# Patient Record
Sex: Female | Born: 1970 | Race: White | Hispanic: No | Marital: Married | Smoking: Never smoker
Health system: Southern US, Community
[De-identification: ages and names within clinical notes are randomized; demographics above are authoritative.]

## PROBLEM LIST (undated history)

## (undated) DIAGNOSIS — T7840XA Allergy, unspecified, initial encounter: Secondary | ICD-10-CM

## (undated) DIAGNOSIS — D649 Anemia, unspecified: Secondary | ICD-10-CM

## (undated) DIAGNOSIS — C4490 Unspecified malignant neoplasm of skin, unspecified: Secondary | ICD-10-CM

## (undated) HISTORY — DX: Unspecified malignant neoplasm of skin, unspecified: C44.90

## (undated) HISTORY — DX: Anemia, unspecified: D64.9

## (undated) HISTORY — DX: Allergy, unspecified, initial encounter: T78.40XA

---

## 2019-09-15 ENCOUNTER — Ambulatory Visit
Admission: EM | Admit: 2019-09-15 | Discharge: 2019-09-15 | Disposition: A | Discharge status: Home / Self Care | Payer: 59 | Attending: Emergency Medicine | Admitting: Emergency Medicine

## 2019-09-15 ENCOUNTER — Other Ambulatory Visit: Payer: Self-pay

## 2019-09-15 ENCOUNTER — Encounter: Payer: Self-pay | Admitting: Emergency Medicine

## 2019-09-15 DIAGNOSIS — J069 Acute upper respiratory infection, unspecified: Secondary | ICD-10-CM

## 2019-09-15 DIAGNOSIS — Z1152 Encounter for screening for COVID-19: Secondary | ICD-10-CM

## 2019-09-15 MED ORDER — CETIRIZINE HCL 10 MG PO TABS: 10.0000 mg | ORAL_TABLET | Freq: Every day | ORAL | 0 refills | Status: AC

## 2019-09-15 MED ORDER — FLUTICASONE PROPIONATE 50 MCG/ACT NA SUSP: 1.0000 | Freq: Every day | NASAL | 0 refills | Status: DC

## 2019-09-15 MED ORDER — PREDNISONE 10 MG PO TABS: 20.0000 mg | ORAL_TABLET | Freq: Every day | ORAL | 0 refills | Status: DC

## 2019-09-15 MED ORDER — BENZONATATE 100 MG PO CAPS: 100.0000 mg | ORAL_CAPSULE | Freq: Three times a day (TID) | ORAL | 0 refills | Status: DC

## 2019-09-15 NOTE — Discharge Instructions (Signed)
COVID testing ordered.  It will take between 2-7 days for test results.  Someone will contact you regarding abnormal results.    In the meantime: You should remain isolated in your home for 10 days from symptom onset AND greater than 24 hours after symptoms resolution (absence of fever without the use of fever-reducing medication and improvement in respiratory symptoms), whichever is longer Get plenty of rest and push fluids Tessalon Perles prescribed for cough Zyrtec for nasal congestion, runny nose, and/or sore throat Flonase for nasal congestion and runny nose Low-dose prednisone was prescribed Use medications daily for symptom relief Use OTC medications like ibuprofen or tylenol as needed fever or pain Call or go to the ED if you have any new or worsening symptoms such as fever, worsening cough, shortness of breath, chest tightness, chest pain, turning blue, changes in mental status, etc..Marland Kitchen

## 2019-09-15 NOTE — ED Provider Notes (Signed)
[place]   [id number] 09/15/19 Arrival Time: 7341   CC: COVID symptoms  SUBJECTIVE: History from: patient.  Marilyn Smith is a 49 y.o. female who presents to the urgent care for complaint of nasal congestion, cough for the past few days.  Denies recent travel.  Has tried OTC medication without relief.  Denies alleviating or aggravating factors.  Denies previous symptoms in the past.   Denies fever, chills, fatigue, sinus pain, rhinorrhea, sore throat, SOB, wheezing, chest pain, nausea, changes in bowel or bladder habits.      ROS: As per HPI.  All other pertinent ROS negative.     History reviewed. No pertinent past medical history. History reviewed. No pertinent surgical history. Allergies  Allergen Reactions  . Penicillins Rash   No current facility-administered medications on file prior to encounter.   No current outpatient medications on file prior to encounter.   Social History   Socioeconomic History  . Marital status: Married    Spouse name: Not on file  . Number of children: Not on file  . Years of education: Not on file  . Highest education level: Not on file  Occupational History  . Not on file  Tobacco Use  . Smoking status: Never Smoker  . Smokeless tobacco: Never Used  Substance and Sexual Activity  . Alcohol use: Never  . Drug use: Never  . Sexual activity: Not on file  Other Topics Concern  . Not on file  Social History Narrative  . Not on file   Social Determinants of Health   Financial Resource Strain:   . Difficulty of Paying Living Expenses: Not on file  Food Insecurity:   . Worried About Charity fundraiser in the Last Year: Not on file  . Ran Out of Food in the Last Year: Not on file  Transportation Needs:   . Lack of Transportation (Medical): Not on file  . Lack of Transportation (Non-Medical): Not on file  Physical Activity:   . Days of Exercise per Week: Not on file  . Minutes of Exercise per Session: Not on file    Stress:   . Feeling of Stress : Not on file  Social Connections:   . Frequency of Communication with Friends and Family: Not on file  . Frequency of Social Gatherings with Friends and Family: Not on file  . Attends Religious Services: Not on file  . Active Member of Clubs or Organizations: Not on file  . Attends Archivist Meetings: Not on file  . Marital Status: Not on file  Intimate Partner Violence:   . Fear of Current or Ex-Partner: Not on file  . Emotionally Abused: Not on file  . Physically Abused: Not on file  . Sexually Abused: Not on file   No family history on file.  OBJECTIVE:  Vitals:   09/15/19 1454 09/15/19 1529  BP: 124/80   Pulse: 92   Resp: 16   Temp: 98.2 F (36.8 C)   TempSrc: Oral   SpO2: 98%   Weight:  140 lb (63.5 kg)  Height:  5' 8"  (1.727 m)     General appearance: alert; appears fatigued, but nontoxic; speaking in full sentences and tolerating own secretions HEENT: NCAT; Ears: EACs clear, TMs pearly gray; Eyes: PERRL.  EOM grossly intact. Sinuses: nontender; Nose: nares patent without rhinorrhea, Throat: oropharynx clear, tonsils non erythematous or enlarged, uvula midline  Neck: supple without LAD Lungs: unlabored respirations, symmetrical air entry; cough: moderate; no respiratory distress;  CTAB Heart: regular rate and rhythm.  Radial pulses 2+ symmetrical bilaterally Skin: warm and dry Psychological: alert and cooperative; normal mood and affect  LABS:  No results found for this or any previous visit (from the past 24 hour(s)).   ASSESSMENT & PLAN:  1. URI with cough and congestion   2. Encounter for screening for COVID-19     Meds ordered this encounter  Medications  . cetirizine (ZYRTEC ALLERGY) 10 MG tablet    Sig: Take 1 tablet (10 mg total) by mouth daily.    Dispense:  30 tablet    Refill:  0  . fluticasone (FLONASE) 50 MCG/ACT nasal spray    Sig: Place 1 spray into both nostrils daily for 14 days.    Dispense:   16 g    Refill:  0  . benzonatate (TESSALON) 100 MG capsule    Sig: Take 1 capsule (100 mg total) by mouth every 8 (eight) hours.    Dispense:  30 capsule    Refill:  0  . predniSONE (DELTASONE) 10 MG tablet    Sig: Take 2 tablets (20 mg total) by mouth daily.    Dispense:  15 tablet    Refill:  0    Discharge Instructions.Marland Kitchen    COVID testing ordered.  It will take between 2-7 days for test results.  Someone will contact you regarding abnormal results.    In the meantime: You should remain isolated in your home for 10 days from symptom onset AND greater than 24 hours after symptoms resolution (absence of fever without the use of fever-reducing medication and improvement in respiratory symptoms), whichever is longer Get plenty of rest and push fluids Tessalon Perles prescribed for cough Zyrtec for nasal congestion, runny nose, and/or sore throat Flonase for nasal congestion and runny nose Low-dose prednisone was prescribed Use medications daily for symptom relief Use OTC medications like ibuprofen or tylenol as needed fever or pain Call or go to the ED if you have any new or worsening symptoms such as fever, worsening cough, shortness of breath, chest tightness, chest pain, turning blue, changes in mental status, etc...   Reviewed expectations re: course of current medical issues. Questions answered. Outlined signs and symptoms indicating need for more acute intervention. Patient verbalized understanding. After Visit Summary given.      Note: This document was prepared using Dragon voice recognition software and may include unintentional dictation errors.    Emerson Monte, [place] 09/15/19 1539

## 2019-09-15 NOTE — ED Triage Notes (Signed)
Coughing up phlegm and head congestion, started last Sunday

## 2019-09-16 LAB — NOVEL CORONAVIRUS, NAA: SARS-CoV-2, NAA: DETECTED — AB

## 2019-09-17 ENCOUNTER — Telehealth: Payer: Self-pay | Admitting: Oncology

## 2019-09-17 NOTE — Telephone Encounter (Signed)
Patient does not meet qualifications for Mab infusion.   Onset of symptoms is greater than 10 days.   Faythe Casa, NP 09/17/2019 1:50 PM

## 2020-04-30 ENCOUNTER — Telehealth (INDEPENDENT_AMBULATORY_CARE_PROVIDER_SITE_OTHER): Payer: Self-pay

## 2020-04-30 NOTE — Telephone Encounter (Signed)
Is this patient OK to set up as a new patient also? I know she is the wife and mother of the other requests.

## 2020-04-30 NOTE — Telephone Encounter (Signed)
Yes, sorry I got interrupted before I could finish chart review. But yes she should be acceptable as well.

## 2020-04-30 NOTE — Telephone Encounter (Signed)
Patient called and stated that she would like to be established as a new patient with [place]. Her husband did some painting for Dr. Anastasio Champion and stated that they would like to come to our practice as they want a PCP and have heard wonderful things about our practice.  No opioids/controlled substances OK to set up as new patient?

## 2020-04-30 NOTE — Telephone Encounter (Signed)
Scheduled patient for new patient appointment on 05/27/2020 with Jeralyn Ruths.

## 2020-05-27 ENCOUNTER — Ambulatory Visit (INDEPENDENT_AMBULATORY_CARE_PROVIDER_SITE_OTHER): Payer: 59 | Admitting: Nurse Practitioner

## 2020-05-27 ENCOUNTER — Encounter (INDEPENDENT_AMBULATORY_CARE_PROVIDER_SITE_OTHER): Payer: Self-pay | Admitting: Nurse Practitioner

## 2020-05-27 ENCOUNTER — Other Ambulatory Visit: Payer: Self-pay

## 2020-05-27 VITALS — BP 102/70 | HR 82 | Temp 96.3°F | Ht 66.5 in | Wt 143.6 lb

## 2020-05-27 DIAGNOSIS — Z13220 Encounter for screening for lipoid disorders: Secondary | ICD-10-CM

## 2020-05-27 DIAGNOSIS — Z131 Encounter for screening for diabetes mellitus: Secondary | ICD-10-CM | POA: Diagnosis not present

## 2020-05-27 DIAGNOSIS — Z1329 Encounter for screening for other suspected endocrine disorder: Secondary | ICD-10-CM | POA: Diagnosis not present

## 2020-05-27 DIAGNOSIS — Z1389 Encounter for screening for other disorder: Secondary | ICD-10-CM

## 2020-05-27 NOTE — Progress Notes (Signed)
Subjective:  Patient ID: Marilyn Smith, female    DOB: [birth year]  Age: 50 y.o. MRN: [id number]  CC:  Chief Complaint  Patient presents with  . Establish Care    Doing well, no concerns      HPI  This patient arrives today for the above.  She is here to establish care because she has not been receiving routine primary care from a doctor in recent history.  She does not have any concerns, but feels that she is time for her to get established with a primary care provider.  Past Medical History:  Diagnosis Date  . Allergy       History reviewed. No pertinent family history.  Social History   Social History Narrative  . Not on file   Social History   Tobacco Use  . Smoking status: Never Smoker  . Smokeless tobacco: Never Used  Substance Use Topics  . Alcohol use: Never     Current Meds  Medication Sig  . cetirizine (ZYRTEC ALLERGY) 10 MG tablet Take 1 tablet (10 mg total) by mouth daily.  . MULTIPLE VITAMIN PO Take 1 capsule by mouth daily.    ROS:  Review of Systems  Constitutional: Negative for fever, malaise/fatigue and weight loss.  Eyes: Negative for blurred vision.  Respiratory: Negative for shortness of breath.   Cardiovascular: Negative for chest pain.  Gastrointestinal: Negative for abdominal pain and blood in stool.  Neurological: Negative for dizziness and headaches.  Psychiatric/Behavioral: Negative for depression and suicidal ideas.     Objective:   Today's Vitals: BP 102/70   Pulse 82   Temp (!) 96.3 F (35.7 C) (Temporal)   Ht 5' 6.5" (1.689 m)   Wt 143 lb 9.6 oz (65.1 kg)   SpO2 99%   BMI 22.83 kg/m  Vitals with BMI 05/27/2020 09/15/2019  Height 5' 6.5" 5' 8"  Weight 143 lbs 10 oz 140 lbs  BMI 40.98 11.91  Systolic 478 295  Diastolic 70 80  Pulse 82 92     Physical Exam Vitals reviewed.  Constitutional:      General: She is not in acute distress.    Appearance: Normal appearance.  HENT:     Head: Normocephalic and  atraumatic.  Neck:     Vascular: No carotid bruit.  Cardiovascular:     Rate and Rhythm: Normal rate and regular rhythm.     Pulses: Normal pulses.     Heart sounds: Normal heart sounds.  Pulmonary:     Effort: Pulmonary effort is normal.     Breath sounds: Normal breath sounds.  Skin:    General: Skin is warm and dry.  Neurological:     General: No focal deficit present.     Mental Status: She is alert and oriented to person, place, and time.  Psychiatric:        Mood and Affect: Mood normal.        Behavior: Behavior normal.        Judgment: Judgment normal.          Assessment and Plan   1. Screening for multiple conditions   2. Screening, lipid   3. Screening for diabetes mellitus   4. Screening for thyroid disorder      Plan: 1.-4.  We will check routine blood work today for further evaluation.  She will follow-up in 1 month for annual physical exam, she was encouraged to call the office if she needs anything before then.  Tests ordered Orders Placed This Encounter  Procedures  . CMP with eGFR([facility])  . CBC with Differential/Platelets  . Lipid Panel  . Hemoglobin A1c  . TSH      No orders of the defined types were placed in this encounter.   Patient to follow-up in 1 month for annual physical exam or sooner as needed.  Ailene Ards, NP

## 2020-05-28 LAB — CBC WITH DIFFERENTIAL/PLATELET
Absolute Monocytes: 272 cells/uL (ref 200–950)
Basophils Absolute: 41 cells/uL (ref 0–200)
Basophils Relative: 0.6 %
Eosinophils Absolute: 68 cells/uL (ref 15–500)
Eosinophils Relative: 1 %
HCT: 33.8 % — ABNORMAL LOW (ref 35.0–45.0)
Hemoglobin: 9.9 g/dL — ABNORMAL LOW (ref 11.7–15.5)
Lymphs Abs: 1550 cells/uL (ref 850–3900)
MCH: 21.5 pg — ABNORMAL LOW (ref 27.0–33.0)
MCHC: 29.3 g/dL — ABNORMAL LOW (ref 32.0–36.0)
MCV: 73.3 fL — ABNORMAL LOW (ref 80.0–100.0)
MPV: 11.4 fL (ref 7.5–12.5)
Monocytes Relative: 4 %
Neutro Abs: 4869 cells/uL (ref 1500–7800)
Neutrophils Relative %: 71.6 %
Platelets: 222 10*3/uL (ref 140–400)
RBC: 4.61 10*6/uL (ref 3.80–5.10)
RDW: 14.5 % (ref 11.0–15.0)
Total Lymphocyte: 22.8 %
WBC: 6.8 10*3/uL (ref 3.8–10.8)

## 2020-05-28 LAB — COMPLETE METABOLIC PANEL WITH GFR
AG Ratio: 2.1 (calc) (ref 1.0–2.5)
ALT: 14 U/L (ref 6–29)
AST: 15 U/L (ref 10–35)
Albumin: 4.7 g/dL (ref 3.6–5.1)
Alkaline phosphatase (APISO): 68 U/L (ref 31–125)
BUN: 22 mg/dL (ref 7–25)
CO2: 26 mmol/L (ref 20–32)
Calcium: 10 mg/dL (ref 8.6–10.2)
Chloride: 105 mmol/L (ref 98–110)
Creat: 0.73 mg/dL (ref 0.50–1.10)
GFR, Est African American: 113 mL/min/{1.73_m2} (ref >=60)
GFR, Est Non African American: 97 mL/min/{1.73_m2} (ref >=60)
Globulin: 2.2 g/dL (calc) (ref 1.9–3.7)
Glucose, Bld: 119 mg/dL (ref 65–139)
Potassium: 4.3 mmol/L (ref 3.5–5.3)
Sodium: 140 mmol/L (ref 135–146)
Total Bilirubin: 0.4 mg/dL (ref 0.2–1.2)
Total Protein: 6.9 g/dL (ref 6.1–8.1)

## 2020-05-28 LAB — HEMOGLOBIN A1C
Hgb A1c MFr Bld: 5 % of total Hgb (ref <5.7)
Mean Plasma Glucose: 97 mg/dL
eAG (mmol/L): 5.4 mmol/L

## 2020-05-28 LAB — LIPID PANEL
Cholesterol: 155 mg/dL (ref <200)
HDL: 58 mg/dL (ref >=50)
LDL Cholesterol (Calc): 78 mg/dL (calc)
Non-HDL Cholesterol (Calc): 97 mg/dL (calc) (ref <130)
Total CHOL/HDL Ratio: 2.7 (calc) (ref <5.0)
Triglycerides: 107 mg/dL (ref <150)

## 2020-05-28 LAB — TSH: TSH: 4.19 mIU/L

## 2020-07-15 ENCOUNTER — Telehealth (INDEPENDENT_AMBULATORY_CARE_PROVIDER_SITE_OTHER): Payer: 59 | Admitting: Nurse Practitioner

## 2020-07-15 ENCOUNTER — Other Ambulatory Visit: Payer: Self-pay

## 2020-07-15 ENCOUNTER — Ambulatory Visit (INDEPENDENT_AMBULATORY_CARE_PROVIDER_SITE_OTHER): Payer: 59 | Admitting: Nurse Practitioner

## 2020-07-15 ENCOUNTER — Encounter (INDEPENDENT_AMBULATORY_CARE_PROVIDER_SITE_OTHER): Payer: Self-pay | Admitting: Nurse Practitioner

## 2020-07-15 VITALS — BP 118/70 | Temp 97.8°F | Resp 18 | Ht 68.0 in | Wt 144.0 lb

## 2020-07-15 DIAGNOSIS — D509 Iron deficiency anemia, unspecified: Secondary | ICD-10-CM | POA: Diagnosis not present

## 2020-07-15 DIAGNOSIS — Z1211 Encounter for screening for malignant neoplasm of colon: Secondary | ICD-10-CM

## 2020-07-15 DIAGNOSIS — M25561 Pain in right knee: Secondary | ICD-10-CM | POA: Diagnosis not present

## 2020-07-15 DIAGNOSIS — Z0001 Encounter for general adult medical examination with abnormal findings: Secondary | ICD-10-CM | POA: Diagnosis not present

## 2020-07-15 NOTE — Progress Notes (Signed)
Subjective:  Patient ID: Marilyn Smith, female    DOB: [birth year]  Age: 50 y.o. MRN: [id number]  CC:  Chief Complaint  Patient presents with   Annual Exam      HPI  This patient arrives today for the above.  As far as health maintenance is concerned she has not had the COVID-19 vaccine is not interested in having this administered right now.  She is probably overdue for tetanus shot as well.  She has not yet had colon cancer screening but is agreeable to be referred to gastroenterology for colonoscopy.  She tells me she is scheduled to have mammogram completed in August of this year.  She does report that she has been having some right knee pain for the last few years.  It is intermittent and seems to get worse sometimes at night, right before her menstrual cycle, and if she bends or squats down.  She has not had it evaluated before now but brings it up today only because it is a bit achy today.  Blood work was collected at last office visit so she is due to discuss this today.  Overall blood work looked great, except she did have some microcytic anemia.  She is still having menstrual cycles.  Past Medical History:  Diagnosis Date   Allergy       History reviewed. No pertinent family history.  Social History   Social History Narrative   Not on file   Social History   Tobacco Use   Smoking status: Never   Smokeless tobacco: Never  Substance Use Topics   Alcohol use: Never     Current Meds  Medication Sig   cetirizine (ZYRTEC ALLERGY) 10 MG tablet Take 1 tablet (10 mg total) by mouth daily.   MULTIPLE VITAMIN PO Take 1 capsule by mouth daily.    ROS:  Review of Systems  Respiratory:  Negative for shortness of breath.   Cardiovascular:  Negative for chest pain.  Neurological:  Negative for dizziness and headaches.    Objective:   Today's Vitals: BP 118/70 (BP Location: Left Arm, Patient Position: Sitting, Cuff Size: Normal)   Temp 97.8 F (36.6 C)  (Temporal)   Resp 18   Ht 5' 8"  (1.727 m)   Wt 144 lb (65.3 kg)   LMP 07/14/2020   SpO2 99%   BMI 21.90 kg/m  Vitals with BMI 07/15/2020 05/27/2020 09/15/2019  Height 5' 8"  5' 6.5" 5' 8"   Weight 144 lbs 143 lbs 10 oz 140 lbs  BMI 21.9 82.80 03.49  Systolic 179 150 569  Diastolic 70 70 80  Pulse - 82 92     Physical Exam Vitals reviewed. Exam conducted with a chaperone present.  Constitutional:      Appearance: Normal appearance.  HENT:     Head: Normocephalic and atraumatic.     Right Ear: Tympanic membrane, ear canal and external ear normal.     Left Ear: Tympanic membrane, ear canal and external ear normal.  Eyes:     General:        Right eye: No discharge.        Left eye: No discharge.     Extraocular Movements: Extraocular movements intact.     Conjunctiva/sclera: Conjunctivae normal.     Pupils: Pupils are equal, round, and reactive to light.  Neck:     Vascular: No carotid bruit.  Cardiovascular:     Rate and Rhythm: Normal rate and regular rhythm.  Pulses: Normal pulses.     Heart sounds: Normal heart sounds. No murmur heard. Pulmonary:     Effort: Pulmonary effort is normal.     Breath sounds: Normal breath sounds.  Chest:  Breasts:    Breasts are symmetrical.     Right: Normal. No supraclavicular adenopathy.     Left: Normal. No supraclavicular adenopathy.     Comments: Nellie Hairston as chaperone Abdominal:     General: Abdomen is flat. Bowel sounds are normal. There is no distension.     Palpations: Abdomen is soft. There is no mass.     Tenderness: There is no abdominal tenderness.  Musculoskeletal:        General: No tenderness.     Cervical back: Neck supple. No muscular tenderness.     Right knee: No swelling or effusion. No tenderness.     Left knee: No swelling or effusion. No tenderness.     Right lower leg: No edema.     Left lower leg: No edema.  Lymphadenopathy:     Cervical: No cervical adenopathy.     Upper Body:     Right upper  body: No supraclavicular adenopathy.     Left upper body: No supraclavicular adenopathy.  Skin:    General: Skin is warm and dry.  Neurological:     General: No focal deficit present.     Mental Status: She is alert and oriented to person, place, and time.     Motor: No weakness.     Gait: Gait normal.  Psychiatric:        Mood and Affect: Mood normal.        Behavior: Behavior normal.        Judgment: Judgment normal.         Assessment and Plan   1. Microcytic anemia   2. Colon cancer screening   3. Acute pain of right knee      Plan: Will evaluate her anemia further by collecting further blood work today.  We will also refer to gastroenterology for further evaluation. 2.  We will refer to gastroenterology for colon cancer screening as well. 3.  We will order ultrasound of the right knee to rule out Baker's cyst.  No significant abnormalities noted on exam today very low suspicion for DVT or infection currently. 4.  She will follow-up with OB/GYN for her mammogram and will go to gastroenterology for colon cancer screening.  We will hold off on tetanus shot today but she was told if she has a cut or any kind of burn to call us at which point we can give her tetanus shot for treatment of this.   Tests ordered Orders Placed This Encounter  Procedures   [place] RT LOWER EXTREM LTD SOFT TISSUE NON VASCULAR   CBC   Iron   Ferritin   Ambulatory referral to Gastroenterology      No orders of the defined types were placed in this encounter.   Patient to follow-up in 3 months or sooner as needed.  In addition to performing an annual physical exam also performed an office visit to address her concerns and abnormalities found on her lab work.  Ailene Ards, NP

## 2020-07-15 NOTE — Telephone Encounter (Signed)
Ordered ultrasound of the right knee.

## 2020-07-16 ENCOUNTER — Encounter (INDEPENDENT_AMBULATORY_CARE_PROVIDER_SITE_OTHER): Payer: Self-pay | Admitting: *Deleted

## 2020-07-17 ENCOUNTER — Other Ambulatory Visit (INDEPENDENT_AMBULATORY_CARE_PROVIDER_SITE_OTHER): Payer: Self-pay | Admitting: Nurse Practitioner

## 2020-07-17 LAB — CBC
HCT: 33.3 % — ABNORMAL LOW (ref 35.0–45.0)
Hemoglobin: 9.7 g/dL — ABNORMAL LOW (ref 11.7–15.5)
MCH: 21.7 pg — ABNORMAL LOW (ref 27.0–33.0)
MCHC: 29.1 g/dL — ABNORMAL LOW (ref 32.0–36.0)
MCV: 74.3 fL — ABNORMAL LOW (ref 80.0–100.0)
MPV: 11.4 fL (ref 7.5–12.5)
Platelets: 291 10*3/uL (ref 140–400)
RBC: 4.48 10*6/uL (ref 3.80–5.10)
RDW: 16.8 % — ABNORMAL HIGH (ref 11.0–15.0)
WBC: 6.2 10*3/uL (ref 3.8–10.8)

## 2020-07-17 LAB — IRON: Iron: 17 ug/dL — ABNORMAL LOW (ref 40–190)

## 2020-07-17 LAB — FERRITIN: Ferritin: 2 ng/mL — ABNORMAL LOW (ref 16–232)

## 2020-07-17 MED ORDER — IRON (FERROUS SULFATE) 325 (65 FE) MG PO TABS: 325.0000 mg | ORAL_TABLET | Freq: Every day | ORAL | 0 refills | Status: DC

## 2020-07-17 NOTE — Progress Notes (Signed)
Adding iron to patient medication list

## 2020-07-27 NOTE — Telephone Encounter (Signed)
Called her we need a copy of insurance card to be sent ASAP.  So I can  get this approved.

## 2020-08-04 ENCOUNTER — Encounter (INDEPENDENT_AMBULATORY_CARE_PROVIDER_SITE_OTHER): Payer: 59 | Admitting: Nurse Practitioner

## 2020-08-07 ENCOUNTER — Ambulatory Visit (HOSPITAL_COMMUNITY)
Admission: RE | Admit: 2020-08-07 | Discharge: 2020-08-07 | Disposition: A | Discharge status: Home / Self Care | Payer: 59 | Source: Ambulatory Visit | Attending: Nurse Practitioner | Admitting: Nurse Practitioner

## 2020-08-07 ENCOUNTER — Other Ambulatory Visit: Payer: Self-pay

## 2020-08-07 DIAGNOSIS — M25561 Pain in right knee: Secondary | ICD-10-CM | POA: Diagnosis present

## 2020-10-15 ENCOUNTER — Ambulatory Visit (INDEPENDENT_AMBULATORY_CARE_PROVIDER_SITE_OTHER): Payer: 59 | Admitting: Internal Medicine

## 2021-01-17 HISTORY — PX: SKIN CANCER EXCISION: SHX779

## 2021-04-22 ENCOUNTER — Ambulatory Visit (INDEPENDENT_AMBULATORY_CARE_PROVIDER_SITE_OTHER): Payer: 59 | Admitting: Nurse Practitioner

## 2021-04-22 ENCOUNTER — Encounter: Payer: Self-pay | Admitting: Nurse Practitioner

## 2021-04-22 VITALS — BP 123/82 | HR 86 | Ht 68.0 in | Wt 141.0 lb

## 2021-04-22 DIAGNOSIS — Z1211 Encounter for screening for malignant neoplasm of colon: Secondary | ICD-10-CM

## 2021-04-22 DIAGNOSIS — Z139 Encounter for screening, unspecified: Secondary | ICD-10-CM | POA: Diagnosis not present

## 2021-04-22 DIAGNOSIS — Z23 Encounter for immunization: Secondary | ICD-10-CM | POA: Diagnosis not present

## 2021-04-22 DIAGNOSIS — D509 Iron deficiency anemia, unspecified: Secondary | ICD-10-CM | POA: Diagnosis not present

## 2021-04-22 DIAGNOSIS — T7840XA Allergy, unspecified, initial encounter: Secondary | ICD-10-CM

## 2021-04-22 MED ORDER — IRON (FERROUS SULFATE) 325 (65 FE) MG PO TABS: 325.0000 mg | ORAL_TABLET | Freq: Every day | ORAL | 3 refills | Status: AC

## 2021-04-22 NOTE — Assessment & Plan Note (Signed)
Patient educated on CDC recommendation for the vaccine. Verbal consent was obtained from the patient, vaccine administered by nurse, no sign of adverse reactions noted at this time. Patient education on arm soreness and use of tylenol  for this patient  was discussed. Patient educated on the signs and symptoms of adverse effect and advise to contact the office if they occur.  ?

## 2021-04-22 NOTE — Progress Notes (Signed)
? ?New Patient Office Visit ? ?Subjective:  ?Patient ID: Marilyn Smith, female    DOB: [birth year]  Age: 51 y.o. MRN: [id number] ? ?CC:  ?Chief Complaint  ?Patient presents with  ? New Patient (Initial Visit)  ?  NP  ? ? ?HPI ?Marilyn Smith with past medical history of anemia, allergies presents to establish care. Previous patient of Dr Conception Chancy.  ? ?She has not been taking iron pill, states that she was not aware that medication was prescribed, patient denies bloody stool, hematemesis, still having monthly periods but denies heavy bleeding. ? ?States that she had a normal mammogram last year, up-to-date with Pap smear, due for colonoscopy and Tdap vaccine.  ? ?Patient denies any concerns today ? ?Past Medical History:  ?Diagnosis Date  ? Allergy   ? Anemia   ? ? ?History reviewed. No pertinent surgical history. ? ?Family History  ?Problem Relation Age of Onset  ? Hypertension Mother   ? Diabetes Mother   ? Hypertension Father   ? Skin cancer Father   ? Breast cancer Neg Hx   ? Colon cancer Neg Hx   ? Lung cancer Neg Hx   ? ? ?Social History  ? ?Socioeconomic History  ? Marital status: Married  ?  Spouse name: Not on file  ? Number of children: 2  ? Years of education: Not on file  ? Highest education level: Not on file  ?Occupational History  ? Not on file  ?Tobacco Use  ? Smoking status: Never  ? Smokeless tobacco: Never  ?Vaping Use  ? Vaping Use: Never used  ?Substance and Sexual Activity  ? Alcohol use: Never  ? Drug use: Never  ? Sexual activity: Yes  ?Other Topics Concern  ? Not on file  ?Social History Narrative  ? Lives with her spouse.   ? ?Social Determinants of Health  ? ?Financial Resource Strain: Not on file  ?Food Insecurity: Not on file  ?Transportation Needs: Not on file  ?Physical Activity: Not on file  ?Stress: Not on file  ?Social Connections: Not on file  ?Intimate Partner Violence: Not on file  ? ? ?ROS ?Review of Systems  ?Constitutional: Negative.   ?Respiratory: Negative.    ?Cardiovascular:  Negative.   ?Gastrointestinal: Negative.   ?Neurological: Negative.   ?Hematological: Negative.   ?Psychiatric/Behavioral: Negative.    ? ?Objective:  ? ?Today's Vitals: BP 123/82 (BP Location: Left Arm, Patient Position: Sitting, Cuff Size: Normal)   Pulse 86   Ht _0  (1.727 m)   Wt 141 lb (64 kg)   LMP 04/03/2021 (Approximate)   SpO2 100%   BMI 21.44 kg/m?  ? ?Physical Exam ?Constitutional:   ?   General: She is not in acute distress. ?   Appearance: Normal appearance. She is not ill-appearing, toxic-appearing or diaphoretic.  ?Cardiovascular:  ?   Rate and Rhythm: Normal rate and regular rhythm.  ?   Pulses: Normal pulses.  ?   Heart sounds: Normal heart sounds. No murmur heard. ?  No friction rub. No gallop.  ?Pulmonary:  ?   Effort: Pulmonary effort is normal. No respiratory distress.  ?   Breath sounds: Normal breath sounds. No stridor. No wheezing, rhonchi or rales.  ?Chest:  ?   Chest wall: No tenderness.  ?Abdominal:  ?   Palpations: Abdomen is soft.  ?   Tenderness: There is no abdominal tenderness.  ?Skin: ?   Capillary Refill: Capillary refill takes less than 2 seconds.  ?Neurological:  ?  Mental Status: She is alert and oriented to person, place, and time.  ?Psychiatric:     ?   Mood and Affect: Mood normal.     ?   Behavior: Behavior normal.     ?   Thought Content: Thought content normal.     ?   Judgment: Judgment normal.  ? ? ?Assessment & Plan:  ? ?Problem List Items Addressed This Visit   ? ?  ? Other  ? Allergies  ?  Takes cetirizine 10 mg daily ?Continue current medication ?  ?  ? Need for Tdap vaccination  ?  Patient educated on CDC recommendation for the vaccine. Verbal consent was obtained from the patient, vaccine administered by nurse, no sign of adverse reactions noted at this time. Patient education on arm soreness and use of tylenol  for this patient  was discussed. Patient educated on the signs and symptoms of adverse effect and advise to contact the office if they occur.  ?  ?   ? Relevant Orders  ? Tdap vaccine greater than or equal to 7yo IM (Completed)  ? Iron deficiency anemia - Primary  ?  Start iron sulfate 325 mg daily ?Recheck labs in 3 months ?Patient told to take stool softener as needed for constipation ?  ?  ? Relevant Medications  ? Iron, Ferrous Sulfate, 325 (65 Fe) MG TABS  ? Other Relevant Orders  ? CBC with Differential  ? ?Other Visit Diagnoses   ? ? Screening due      ? Relevant Orders  ? CMP14+EGFR  ? HgB A1c  ? Lipid Profile  ? TSH + free T4  ? Vitamin D (25 hydroxy)  ? HIV antibody (with reflex)  ? Hepatitis C Antibody  ? Screening for colon cancer      ? Relevant Orders  ? Cologuard  ? ?  ? ? ?Outpatient Encounter Medications as of 04/22/2021  ?Medication Sig  ? cetirizine (ZYRTEC ALLERGY) 10 MG tablet Take 1 tablet (10 mg total) by mouth daily.  ? MULTIPLE VITAMIN PO Take 1 capsule by mouth daily.  ? Iron, Ferrous Sulfate, 325 (65 Fe) MG TABS Take 325 mg by mouth daily.  ? [DISCONTINUED] Iron, Ferrous Sulfate, 325 (65 Fe) MG TABS Take 325 mg by mouth daily. (Patient not taking: Reported on 04/22/2021)  ? ?No facility-administered encounter medications on file as of 04/22/2021.  ? ? ?Follow-up: Return in about 3 months (around 07/22/2021) for annual physical exam.  ? ?Renee Rival, FNP ? ?

## 2021-04-22 NOTE — Assessment & Plan Note (Signed)
Start iron sulfate 325 mg daily ?Recheck labs in 3 months ?Patient told to take stool softener as needed for constipation ?

## 2021-04-22 NOTE — Assessment & Plan Note (Signed)
Takes cetirizine 10 mg daily ?Continue current medication ?

## 2021-04-22 NOTE — Patient Instructions (Addendum)
Please start taking ferrous sulphate 3102m tablet , one tablet daily. ?Please get your labs done 3-5 days before your next visit ? ? ?It is important that you exercise regularly at least 30 minutes 5 times a week.  ?Think about what you will eat, plan ahead. ?Choose " clean, green, fresh or frozen" over canned, processed or packaged foods which are more sugary, salty and fatty. ?70 to 75% of food eaten should be vegetables and fruit. ?Three meals at set times with snacks allowed between meals, but they must be fruit or vegetables. ?Aim to eat over a 12 hour period , example 7 am to 7 pm, and STOP after  your last meal of the day. ?Drink water,generally about 64 ounces per day, no other drink is as healthy. Fruit juice is best enjoyed in a healthy way, by EATING the fruit. ? ?Thanks for choosing  Primary [facility], we consider it a privelige to serve you. ? ?

## 2021-05-19 LAB — COLOGUARD: COLOGUARD: NEGATIVE

## 2021-05-19 NOTE — Progress Notes (Signed)
COLOGUARD  Test Negative , repeat in 3 years ?

## 2021-05-20 NOTE — Progress Notes (Signed)
Negative cologuard, repeat in 3 years

## 2021-07-22 LAB — HEMOGLOBIN A1C
Est. average glucose Bld gHb Est-mCnc: 97 mg/dL
Hgb A1c MFr Bld: 5 % (ref 4.8–5.6)

## 2021-07-22 LAB — CMP14+EGFR
ALT: 20 IU/L (ref 0–32)
AST: 22 IU/L (ref 0–40)
Albumin/Globulin Ratio: 2 (ref 1.2–2.2)
Albumin: 4.7 g/dL (ref 3.8–4.8)
Alkaline Phosphatase: 74 IU/L (ref 44–121)
BUN/Creatinine Ratio: 20 (ref 9–23)
BUN: 17 mg/dL (ref 6–24)
Bilirubin Total: 0.4 mg/dL (ref 0.0–1.2)
CO2: 24 mmol/L (ref 20–29)
Calcium: 9.7 mg/dL (ref 8.7–10.2)
Chloride: 104 mmol/L (ref 96–106)
Creatinine, Ser: 0.85 mg/dL (ref 0.57–1.00)
Globulin, Total: 2.3 g/dL (ref 1.5–4.5)
Glucose: 84 mg/dL (ref 70–99)
Potassium: 4.7 mmol/L (ref 3.5–5.2)
Sodium: 142 mmol/L (ref 134–144)
Total Protein: 7 g/dL (ref 6.0–8.5)
eGFR: 84 mL/min/{1.73_m2} (ref >59)

## 2021-07-22 LAB — CBC WITH DIFFERENTIAL/PLATELET
Basophils Absolute: 0 10*3/uL (ref 0.0–0.2)
Basos: 1 %
EOS (ABSOLUTE): 0.1 10*3/uL (ref 0.0–0.4)
Eos: 3 %
Hematocrit: 43.1 % (ref 34.0–46.6)
Hemoglobin: 14.3 g/dL (ref 11.1–15.9)
Immature Grans (Abs): 0 10*3/uL (ref 0.0–0.1)
Immature Granulocytes: 0 %
Lymphocytes Absolute: 1.1 10*3/uL (ref 0.7–3.1)
Lymphs: 27 %
MCH: 28.4 pg (ref 26.6–33.0)
MCHC: 33.2 g/dL (ref 31.5–35.7)
MCV: 86 fL (ref 79–97)
Monocytes Absolute: 0.2 10*3/uL (ref 0.1–0.9)
Monocytes: 6 %
Neutrophils Absolute: 2.5 10*3/uL (ref 1.4–7.0)
Neutrophils: 63 %
Platelets: 223 10*3/uL (ref 150–450)
RBC: 5.03 x10E6/uL (ref 3.77–5.28)
RDW: 14.5 % (ref 11.7–15.4)
WBC: 3.9 10*3/uL (ref 3.4–10.8)

## 2021-07-22 LAB — HIV ANTIBODY (ROUTINE TESTING W REFLEX): HIV Screen 4th Generation wRfx: NONREACTIVE

## 2021-07-22 LAB — TSH+FREE T4
Free T4: 1.37 ng/dL (ref 0.82–1.77)
TSH: 4.29 u[IU]/mL (ref 0.450–4.500)

## 2021-07-22 LAB — LIPID PANEL
Chol/HDL Ratio: 2.9 ratio (ref 0.0–4.4)
Cholesterol, Total: 183 mg/dL (ref 100–199)
HDL: 63 mg/dL (ref >39)
LDL Chol Calc (NIH): 106 mg/dL — ABNORMAL HIGH (ref 0–99)
Triglycerides: 76 mg/dL (ref 0–149)
VLDL Cholesterol Cal: 14 mg/dL (ref 5–40)

## 2021-07-22 LAB — VITAMIN D 25 HYDROXY (VIT D DEFICIENCY, FRACTURES): Vit D, 25-Hydroxy: 36.1 ng/mL (ref 30.0–100.0)

## 2021-07-22 LAB — HEPATITIS C ANTIBODY: Hep C Virus Ab: NONREACTIVE

## 2021-07-22 NOTE — Progress Notes (Signed)
Will review results at her upcoming appointment

## 2021-07-27 ENCOUNTER — Encounter: Payer: Self-pay | Admitting: Nurse Practitioner

## 2021-07-27 ENCOUNTER — Ambulatory Visit (INDEPENDENT_AMBULATORY_CARE_PROVIDER_SITE_OTHER): Payer: 59 | Admitting: Nurse Practitioner

## 2021-07-27 VITALS — BP 114/74 | HR 69 | Ht 68.0 in | Wt 142.0 lb

## 2021-07-27 DIAGNOSIS — D509 Iron deficiency anemia, unspecified: Secondary | ICD-10-CM | POA: Diagnosis not present

## 2021-07-27 DIAGNOSIS — Z0001 Encounter for general adult medical examination with abnormal findings: Secondary | ICD-10-CM

## 2021-07-27 DIAGNOSIS — Z0000 Encounter for general adult medical examination without abnormal findings: Secondary | ICD-10-CM

## 2021-07-27 NOTE — Assessment & Plan Note (Addendum)
Annual exam as documented.  Counseling done include healthy lifestyle involving committing to 150 minutes of exercise per week, heart healthy diet, and attaining healthy weight. The importance of adequate sleep also discussed.  Regular use of seat belt and home safety were also discussed . upto date with PAP, mammogram , colon cancer screening

## 2021-07-27 NOTE — Assessment & Plan Note (Signed)
CBC is normal Currently taking ferrous sulfate '325mg'$  daily  Check iron panel

## 2021-07-27 NOTE — Patient Instructions (Signed)

## 2021-07-27 NOTE — Progress Notes (Signed)
Complete physical exam  Patient: Marilyn Smith   DOB: Aug 17, 1970   51 y.o. Female  MRN: [id number]  Subjective:    Chief Complaint  Patient presents with   Annual Exam    cpe    Marilyn Smith is a 51 y.o. female with past medical history of anemia, skin cancer who presents today for a complete physical exam. She reports consuming a low fat and low sodium diet. Sh does walking exercises every evening  She generally feels well. She reports sleeping well. She does not have additional problems to discuss today.    Most recent fall risk assessment:    07/27/2021    2:53 PM  [place] in the past year? 0  Number falls in past yr: 0  Injury with Fall? 0  Risk for fall due to : No Fall Risks  Follow up Falls evaluation completed     Most recent depression screenings:    07/27/2021    2:53 PM 04/22/2021    2:19 PM  PHQ 2/9 Scores  PHQ - 2 Score 0 0        Patient Care Team: Renee Rival, FNP as PCP - General (Nurse Practitioner)   Outpatient Medications Prior to Visit  Medication Sig   cetirizine (ZYRTEC ALLERGY) 10 MG tablet Take 1 tablet (10 mg total) by mouth daily.   Iron, Ferrous Sulfate, 325 (65 Fe) MG TABS Take 325 mg by mouth daily.   MULTIPLE VITAMIN PO Take 1 capsule by mouth daily.   No facility-administered medications prior to visit.    Review of Systems  Constitutional: Negative.  Negative for chills, fever and weight loss.  HENT: Negative.  Negative for ear discharge, ear pain, hearing loss, nosebleeds and tinnitus.   Eyes: Negative.  Negative for blurred vision, double vision, photophobia, pain and discharge.  Respiratory: Negative.  Negative for cough, hemoptysis and sputum production.   Cardiovascular: Negative.  Negative for chest pain, palpitations, orthopnea and claudication.  Gastrointestinal: Negative.  Negative for abdominal pain, constipation, diarrhea, heartburn, nausea and vomiting.  Genitourinary: Negative.  Negative for  dysuria, frequency and urgency.  Musculoskeletal: Negative.  Negative for back pain, joint pain, myalgias and neck pain.  Skin: Negative.  Negative for itching and rash.  Neurological: Negative.  Negative for dizziness, tingling, tremors, sensory change, speech change, focal weakness, weakness and headaches.  Endo/Heme/Allergies: Negative.  Negative for environmental allergies and polydipsia. Does not bruise/bleed easily.  Psychiatric/Behavioral: Negative.  Negative for depression, hallucinations, substance abuse and suicidal ideas. The patient is not nervous/anxious and does not have insomnia.           Objective:     BP 114/74 (BP Location: Right Arm, Patient Position: Sitting, Cuff Size: Normal)   Pulse 69   Ht '5\' 8"'$  (1.727 m)   Wt 142 lb (64.4 kg)   LMP 07/27/2021 (Approximate)   SpO2 98%   BMI 21.59 kg/m    Physical Exam Constitutional:      General: She is not in acute distress.    Appearance: Normal appearance. She is not ill-appearing, toxic-appearing or diaphoretic.  HENT:     Head: Normocephalic and atraumatic.     Right Ear: Tympanic membrane, ear canal and external ear normal. There is no impacted cerumen.     Left Ear: Tympanic membrane, ear canal and external ear normal. There is no impacted cerumen.     Nose: Nose normal. No congestion or rhinorrhea.     Mouth/Throat:  Mouth: Mucous membranes are moist.     Pharynx: Oropharynx is clear. No oropharyngeal exudate or posterior oropharyngeal erythema.  Eyes:     General: No scleral icterus.       Right eye: No discharge.        Left eye: No discharge.     Extraocular Movements: Extraocular movements intact.     Conjunctiva/sclera: Conjunctivae normal.     Pupils: Pupils are equal, round, and reactive to light.  Neck:     Vascular: No carotid bruit.  Cardiovascular:     Rate and Rhythm: Normal rate and regular rhythm.     Pulses: Normal pulses.     Heart sounds: Normal heart sounds. No murmur heard.     No friction rub. No gallop.  Pulmonary:     Effort: Pulmonary effort is normal. No respiratory distress.     Breath sounds: Normal breath sounds. No stridor. No wheezing, rhonchi or rales.  Chest:     Chest wall: No mass, lacerations, deformity, swelling, tenderness, crepitus or edema. There is no dullness to percussion.  Breasts:    Tanner Score is 5.     Breasts are symmetrical.     Right: Normal. No swelling, bleeding, inverted nipple, mass, nipple discharge, skin change or tenderness.     Left: Normal. No swelling, bleeding, inverted nipple, mass, nipple discharge, skin change or tenderness.  Abdominal:     General: There is no distension.     Palpations: Abdomen is soft. There is no mass.     Tenderness: There is no abdominal tenderness. There is no right CVA tenderness, left CVA tenderness, guarding or rebound.     Hernia: No hernia is present.  Musculoskeletal:        General: No swelling, tenderness, deformity or signs of injury. Normal range of motion.     Cervical back: Normal range of motion. No rigidity or tenderness.     Right lower leg: No edema.     Left lower leg: No edema.  Lymphadenopathy:     Cervical: No cervical adenopathy.     Upper Body:     Right upper body: No supraclavicular, axillary or pectoral adenopathy.     Left upper body: No supraclavicular, axillary or pectoral adenopathy.  Skin:    Capillary Refill: Capillary refill takes less than 2 seconds.     Coloration: Skin is not jaundiced or pale.     Findings: No bruising, erythema, lesion or rash.  Neurological:     Mental Status: She is alert and oriented to person, place, and time.     Cranial Nerves: No cranial nerve deficit.     Sensory: No sensory deficit.     Motor: No weakness.     Coordination: Coordination normal.     Gait: Gait normal.     Deep Tendon Reflexes: Reflexes normal.  Psychiatric:        Mood and Affect: Mood normal.        Behavior: Behavior normal.        Thought Content:  Thought content normal.        Judgment: Judgment normal.      No results found for any visits on 07/27/21.     Assessment & Plan:    Routine Health Maintenance and Physical Exam  Immunization History  Administered Date(s) Administered   Influenza,inj,Quad PF,6+ Mos 10/30/2018   Tdap 04/22/2021    Health Maintenance  Topic Date Due   INFLUENZA VACCINE  08/17/2021   PAP  SMEAR-Modifier  01/17/2022   Fecal DNA (Cologuard)  05/08/2024   TETANUS/TDAP  04/23/2031   Hepatitis C Screening  Completed   HIV Screening  Completed   HPV VACCINES  Aged Out   COVID-19 Vaccine  Discontinued    Discussed health benefits of physical activity, and encouraged her to engage in regular exercise appropriate for her age and condition.  Problem List Items Addressed This Visit       Other   Iron deficiency anemia    CBC is normal Currently taking ferrous sulfate '325mg'$  daily  Check iron panel       Relevant Orders   Fe+TIBC+Fer   Annual physical exam - Primary    Annual exam as documented.  Counseling done include healthy lifestyle involving committing to 150 minutes of exercise per week, heart healthy diet, and attaining healthy weight. The importance of adequate sleep also discussed.  Regular use of seat belt and home safety were also discussed . upto date with PAP, mammogram , colon cancer screening       Return in about 6 months (around 01/27/2022) for anemia .     Renee Rival, FNP

## 2021-07-29 LAB — IRON,TIBC AND FERRITIN PANEL
Ferritin: 22 ng/mL (ref 15–150)
Iron Saturation: 33 % (ref 15–55)
Iron: 134 ug/dL (ref 27–159)
Total Iron Binding Capacity: 409 ug/dL (ref 250–450)
UIBC: 275 ug/dL (ref 131–425)

## 2021-07-29 NOTE — Progress Notes (Signed)
Iron level is normal, continue iron supplement for now  to build up body iron store till next visit in 6 months

## 2021-10-13 LAB — HM MAMMOGRAPHY

## 2021-12-17 ENCOUNTER — Encounter: Payer: Self-pay | Admitting: Nurse Practitioner

## 2022-01-27 ENCOUNTER — Ambulatory Visit: Payer: 59 | Admitting: Nurse Practitioner

## 2022-07-05 ENCOUNTER — Encounter: Payer: 59 | Admitting: Family Medicine

## 2022-07-12 ENCOUNTER — Ambulatory Visit (INDEPENDENT_AMBULATORY_CARE_PROVIDER_SITE_OTHER): Payer: 59 | Admitting: Family Medicine

## 2022-07-12 ENCOUNTER — Encounter: Payer: Self-pay | Admitting: Family Medicine

## 2022-07-12 VITALS — BP 114/82 | HR 71 | Ht 68.0 in | Wt 146.1 lb

## 2022-07-12 DIAGNOSIS — Z0001 Encounter for general adult medical examination with abnormal findings: Secondary | ICD-10-CM | POA: Diagnosis not present

## 2022-07-12 DIAGNOSIS — E559 Vitamin D deficiency, unspecified: Secondary | ICD-10-CM

## 2022-07-12 DIAGNOSIS — E0789 Other specified disorders of thyroid: Secondary | ICD-10-CM

## 2022-07-12 DIAGNOSIS — N3090 Cystitis, unspecified without hematuria: Secondary | ICD-10-CM | POA: Diagnosis not present

## 2022-07-12 DIAGNOSIS — R350 Frequency of micturition: Secondary | ICD-10-CM

## 2022-07-12 DIAGNOSIS — B351 Tinea unguium: Secondary | ICD-10-CM

## 2022-07-12 DIAGNOSIS — R7301 Impaired fasting glucose: Secondary | ICD-10-CM

## 2022-07-12 DIAGNOSIS — E7849 Other hyperlipidemia: Secondary | ICD-10-CM

## 2022-07-12 LAB — POCT URINALYSIS DIP (CLINITEK)
Bilirubin, UA: NEGATIVE
Blood, UA: NEGATIVE
Glucose, UA: NEGATIVE mg/dL
Ketones, POC UA: NEGATIVE mg/dL
Nitrite, UA: NEGATIVE
POC PROTEIN,UA: NEGATIVE
Spec Grav, UA: 1.01 (ref 1.010–1.025)
Urobilinogen, UA: 0.2 E.U./dL
pH, UA: 6.5 (ref 5.0–8.0)

## 2022-07-12 MED ORDER — SULFAMETHOXAZOLE-TRIMETHOPRIM 800-160 MG PO TABS: 1.0000 | ORAL_TABLET | Freq: Two times a day (BID) | ORAL | 0 refills | Status: AC

## 2022-07-12 NOTE — Assessment & Plan Note (Addendum)
Chronic condition bilaterally Has tried topical antifugal creams with minimum relief Will consider oral terbinafine after assessing her liver enzymes Encouraged to keep the feet clean and dry and avoid sharing nail tools such as clippers and scissors

## 2022-07-12 NOTE — Patient Instructions (Addendum)
I appreciate the opportunity to provide care to you today!    Follow up:  1 year for cpe  Labs: please stop by the lab the during the week to get your blood drawn (CBC, CMP, TSH, Lipid profile, HgA1c, Vit D)  Please complete the full course of the antibiotics   You can help prevent UTIs by doing the following:  -Avoid holding urine for prolonged periods; this stretches the bladder and causes bacteria to form because bacteria like warm and wet environments to grow -Empty the bladder as soon as the need arises.  -Empty your bladder soon after intercourse.  -Take showers instead of baths -Wipe front to back; doing so after urinating and after a bowel movement helps prevent bacteria in the anal region from spreading to the vagina and urethra. -Also, drink a full glass of water to help flush bacteria.    Please continue to a heart-healthy diet and increase your physical activities. Try to exercise for at least five days a week.      It was a pleasure to see you and I look forward to continuing to work together on your health and well-being. Please do not hesitate to call the office if you need care or have questions about your care.   Have a wonderful day and week. With Gratitude, Gilmore Laroche MSN, FNP-BC

## 2022-07-12 NOTE — Assessment & Plan Note (Signed)
Physical exam as documented Discussed heart-healthy diet  Encouraged to Exercise: If you are able: 30 -60 minutes a day ,4 days a week, or 150 minutes a week. The longer the better. Combine stretch, strength, and aerobic activities Encourage to eat whole Food, Plant Predominant Nutrition is highly recommended: Eat Plenty of vegetables, Mushrooms, fruits, Legumes, Whole Grains, Nuts, seeds in lieu of processed meats, processed snacks/pastries red meat, poultry, eggs.  Will f/u in 1 year for CPE    

## 2022-07-12 NOTE — Assessment & Plan Note (Addendum)
U/a shows small leukocytes Given that the patient is symptomatic, will treat with bactrim for 5 days  Please complete the full course of the antibiotics   You can help prevent UTIs by doing the following:  -Avoid holding urine for prolonged periods; this stretches the bladder and causes bacteria to form because bacteria like warm and wet environments to grow -Empty the bladder as soon as the need arises.  -Empty your bladder soon after intercourse.  -Take showers instead of baths -Wipe front to back; doing so after urinating and after a bowel movement helps prevent bacteria in the anal region from spreading to the vagina and urethra. -Also, drink a full glass of water to help flush bacteria.

## 2022-07-12 NOTE — Progress Notes (Signed)
Complete physical exam  Patient: Marilyn Smith   DOB: 1970-07-29   51 y.o. Female  MRN: [id number]  Subjective:    Chief Complaint  Patient presents with   Annual Exam    Cpe today.   Urinary Frequency    Pt reports urinary frequency, onset 2 days ago.     Janus Vlcek is a 52 y.o. female who presents today for a complete physical exam. She reports consuming a general diet.  The patient walks at home at least 3 days weekly  She generally feels well. She reports sleeping well. She does have additional problems to discuss today.    Most recent fall risk assessment:    07/12/2022   10:24 AM  Fall Risk   Falls in the past year? 0  Number falls in past yr: 0  Injury with Fall? 0  Risk for fall due to : No Fall Risks  Follow up Falls evaluation completed     Most recent depression screenings:    07/12/2022   10:24 AM 07/27/2021    2:53 PM  PHQ 2/9 Scores  PHQ - 2 Score 0 0  PHQ- 9 Score 0     Dental: No current dental problems and No regular dental care   Patient Active Problem List   Diagnosis Date Noted   Onychomycosis 07/12/2022   Encounter for general adult medical examination with abnormal findings 07/12/2022   Cystitis 07/12/2022   Annual physical exam 07/27/2021   Allergies 04/22/2021   Need for Tdap vaccination 04/22/2021   Iron deficiency anemia 04/22/2021   Past Medical History:  Diagnosis Date   Allergy    Anemia    Skin cancer    Past Surgical History:  Procedure Laterality Date   SKIN CANCER EXCISION  2023   Social History   Tobacco Use   Smoking status: Never   Smokeless tobacco: Never  Vaping Use   Vaping Use: Never used  Substance Use Topics   Alcohol use: Yes    Comment: occa   Drug use: Never   Social History   Socioeconomic History   Marital status: Married    Spouse name: Not on file   Number of children: 2   Years of education: Not on file   Highest education level: Not on file  Occupational History   Not on file   Tobacco Use   Smoking status: Never   Smokeless tobacco: Never  Vaping Use   Vaping Use: Never used  Substance and Sexual Activity   Alcohol use: Yes    Comment: occa   Drug use: Never   Sexual activity: Yes  Other Topics Concern   Not on file  Social History Narrative   Lives with her spouse.    Social Determinants of Health   Financial Resource Strain: Not on file  Food Insecurity: Not on file  Transportation Needs: Not on file  Physical Activity: Not on file  Stress: Not on file  Social Connections: Not on file  Intimate Partner Violence: Not on file   Family Status  Relation Name Status   Mother  Alive   Father  Alive   Son  Alive       had congenital hypothyroidism   Neg Hx  (Not Specified)   Family History  Problem Relation Age of Onset   Hypertension Mother    Diabetes Mother    Hypertension Father    Skin cancer Father    Breast cancer Neg Hx  Colon cancer Neg Hx    Lung cancer Neg Hx    Allergies  Allergen Reactions   Penicillins Rash      Patient Care Team: Gilmore Laroche, FNP as PCP - General (Family Medicine)   Outpatient Medications Prior to Visit  Medication Sig   cetirizine (ZYRTEC ALLERGY) 10 MG tablet Take 1 tablet (10 mg total) by mouth daily.   Iron, Ferrous Sulfate, 325 (65 Fe) MG TABS Take 325 mg by mouth daily.   MULTIPLE VITAMIN PO Take 1 capsule by mouth daily.   No facility-administered medications prior to visit.    Review of Systems  Constitutional:  Negative for chills, fever and malaise/fatigue.  HENT:  Negative for congestion and sinus pain.   Eyes:  Negative for pain, discharge and redness.  Respiratory:  Negative for cough, sputum production and shortness of breath.   Cardiovascular:  Negative for chest pain, palpitations, claudication and leg swelling.  Gastrointestinal:  Negative for diarrhea, heartburn and nausea.  Genitourinary:  Positive for dysuria, frequency and urgency. Negative for flank pain.   Musculoskeletal:  Negative for back pain and joint pain.  Skin:  Negative for itching.       Toenails discoloration  Neurological:  Negative for dizziness, seizures and headaches.  Endo/Heme/Allergies:  Negative for environmental allergies.  Psychiatric/Behavioral:  Negative for memory loss. The patient does not have insomnia.        Objective:    BP 114/82   Pulse 71   Ht 5\' 8"  (1.727 m)   Wt 146 lb 1.9 oz (66.3 kg)   SpO2 97%   BMI 22.22 kg/m  BP Readings from Last 3 Encounters:  07/12/22 114/82  07/27/21 114/74  04/22/21 123/82   Wt Readings from Last 3 Encounters:  07/12/22 146 lb 1.9 oz (66.3 kg)  07/27/21 142 lb (64.4 kg)  04/22/21 141 lb (64 kg)    Physical Exam HENT:     Head: Normocephalic.     Left Ear: External ear normal.     Mouth/Throat:     Mouth: Mucous membranes are moist.  Eyes:     Extraocular Movements: Extraocular movements intact.     Pupils: Pupils are equal, round, and reactive to light.  Cardiovascular:     Rate and Rhythm: Normal rate and regular rhythm.     Heart sounds: No murmur heard. Pulmonary:     Effort: Pulmonary effort is normal.     Breath sounds: Normal breath sounds.  Abdominal:     Palpations: Abdomen is soft.     Tenderness: There is no abdominal tenderness. There is no right CVA tenderness or left CVA tenderness.  Musculoskeletal:     Right lower leg: No edema.     Left lower leg: No edema.     Comments: Fungal infection of the toenails  Skin:    Findings: No lesion or rash.  Neurological:     Mental Status: She is alert and oriented to person, place, and time.     GCS: GCS eye subscore is 4. GCS verbal subscore is 5. GCS motor subscore is 6.     Cranial Nerves: No facial asymmetry.     Sensory: No sensory deficit.     Motor: No weakness.     Coordination: Coordination normal. Finger-Nose-Finger Test normal.     Gait: Gait normal.  Psychiatric:        Judgment: Judgment normal.     Results for orders placed  or performed in visit on 07/12/22  POCT URINALYSIS DIP (CLINITEK)  Result Value Ref Range   Color, UA yellow yellow   Clarity, UA clear clear   Glucose, UA negative negative mg/dL   Bilirubin, UA negative negative   Ketones, POC UA negative negative mg/dL   Spec Grav, UA 0.623 7.628 - 1.025   Blood, UA negative negative   pH, UA 6.5 5.0 - 8.0   POC PROTEIN,UA negative negative, trace   Urobilinogen, UA 0.2 0.2 or 1.0 E.U./dL   Nitrite, UA Negative Negative   Leukocytes, UA Small (1+) (A) Negative   Last CBC Lab Results  Component Value Date   WBC 3.9 07/21/2021   HGB 14.3 07/21/2021   HCT 43.1 07/21/2021   MCV 86 07/21/2021   MCH 28.4 07/21/2021   RDW 14.5 07/21/2021   PLT 223 07/21/2021   Last metabolic panel Lab Results  Component Value Date   GLUCOSE 84 07/21/2021   NA 142 07/21/2021   K 4.7 07/21/2021   CL 104 07/21/2021   CO2 24 07/21/2021   BUN 17 07/21/2021   CREATININE 0.85 07/21/2021   EGFR 84 07/21/2021   CALCIUM 9.7 07/21/2021   PROT 7.0 07/21/2021   ALBUMIN 4.7 07/21/2021   LABGLOB 2.3 07/21/2021   AGRATIO 2.0 07/21/2021   BILITOT 0.4 07/21/2021   ALKPHOS 74 07/21/2021   AST 22 07/21/2021   ALT 20 07/21/2021   Last lipids Lab Results  Component Value Date   CHOL 183 07/21/2021   HDL 63 07/21/2021   LDLCALC 106 (H) 07/21/2021   TRIG 76 07/21/2021   CHOLHDL 2.9 07/21/2021   Last hemoglobin A1c Lab Results  Component Value Date   HGBA1C 5.0 07/21/2021   Last thyroid functions Lab Results  Component Value Date   TSH 4.290 07/21/2021   Last vitamin D Lab Results  Component Value Date   VD25OH 36.1 07/21/2021   Last vitamin B12 and Folate No results found for: "VITAMINB12", "FOLATE"      Assessment & Plan:    Routine Health Maintenance and Physical Exam  Immunization History  Administered Date(s) Administered   Influenza,inj,Quad PF,6+ Mos 10/30/2018   Tdap 04/22/2021    Health Maintenance  Topic Date Due   Zoster Vaccines-  Shingrix (1 of 2) Never done   PAP SMEAR-Modifier  01/17/2022   INFLUENZA VACCINE  08/18/2022   MAMMOGRAM  10/14/2023   Fecal DNA (Cologuard)  05/08/2024   DTaP/Tdap/Td (2 - Td or Tdap) 04/23/2031   Hepatitis C Screening  Completed   HIV Screening  Completed   HPV VACCINES  Aged Out   COVID-19 Vaccine  Discontinued    Discussed health benefits of physical activity, and encouraged her to engage in regular exercise appropriate for her age and condition.  Encounter for general adult medical examination with abnormal findings Assessment & Plan: Physical exam as documented Discussed heart-healthy diet  Encouraged to Exercise: If you are able: 30 -60 minutes a day ,4 days a week, or 150 minutes a week. The longer the better. Combine stretch, strength, and aerobic activities Encourage to eat whole Food, Plant Predominant Nutrition is highly recommended: Eat Plenty of vegetables, Mushrooms, fruits, Legumes, Whole Grains, Nuts, seeds in lieu of processed meats, processed snacks/pastries red meat, poultry, eggs.  Will f/u in 1 year for CPE      Onychomycosis Assessment & Plan: Chronic condition bilaterally Has tried topical antifugal creams with minimum relief Will consider oral terbinafine after assessing her liver enzymes Encouraged to keep the feet clean and dry and  avoid sharing nail tools such as clippers and scissors   Cystitis Assessment & Plan: U/a shows small leukocytes Given that the patient is symptomatic, will treat with bactrim for 5 days  Please complete the full course of the antibiotics   You can help prevent UTIs by doing the following:  -Avoid holding urine for prolonged periods; this stretches the bladder and causes bacteria to form because bacteria like warm and wet environments to grow -Empty the bladder as soon as the need arises.  -Empty your bladder soon after intercourse.  -Take showers instead of baths -Wipe front to back; doing so after urinating and  after a bowel movement helps prevent bacteria in the anal region from spreading to the vagina and urethra. -Also, drink a full glass of water to help flush bacteria.    Orders: -     Sulfamethoxazole-Trimethoprim; Take 1 tablet by mouth 2 (two) times daily for 5 days.  Dispense: 10 tablet; Refill: 0  Vitamin D deficiency -     VITAMIN D 25 Hydroxy (Vit-D Deficiency, Fractures)  Impaired fasting blood sugar -     Hemoglobin A1c  Other hyperlipidemia -     CMP14+EGFR -     CBC with Differential/Platelet -     Lipid panel  Other specified disorders of thyroid -     TSH + free T4  Urinary frequency -     POCT URINALYSIS DIP (CLINITEK)    Return in about 1 year (around 07/12/2023) for CPE.   Note: This chart has been completed using Engineer, civil (consulting) software, and while attempts have been made to ensure accuracy, certain words and phrases may not be transcribed as intended.    Gilmore Laroche, FNP

## 2022-07-14 ENCOUNTER — Encounter: Payer: Self-pay | Admitting: Family Medicine

## 2022-07-14 ENCOUNTER — Other Ambulatory Visit: Payer: Self-pay | Admitting: Family Medicine

## 2022-07-14 DIAGNOSIS — B351 Tinea unguium: Secondary | ICD-10-CM

## 2022-07-14 LAB — CBC WITH DIFFERENTIAL/PLATELET
Basophils Absolute: 0 10*3/uL (ref 0.0–0.2)
Basos: 1 %
EOS (ABSOLUTE): 0.1 10*3/uL (ref 0.0–0.4)
Eos: 3 %
Hematocrit: 42.2 % (ref 34.0–46.6)
Hemoglobin: 13.7 g/dL (ref 11.1–15.9)
Immature Grans (Abs): 0 10*3/uL (ref 0.0–0.1)
Immature Granulocytes: 0 %
Lymphocytes Absolute: 1.2 10*3/uL (ref 0.7–3.1)
Lymphs: 29 %
MCH: 28.9 pg (ref 26.6–33.0)
MCHC: 32.5 g/dL (ref 31.5–35.7)
MCV: 89 fL (ref 79–97)
Monocytes Absolute: 0.3 10*3/uL (ref 0.1–0.9)
Monocytes: 7 %
Neutrophils Absolute: 2.7 10*3/uL (ref 1.4–7.0)
Neutrophils: 60 %
Platelets: 246 10*3/uL (ref 150–450)
RBC: 4.74 x10E6/uL (ref 3.77–5.28)
RDW: 12.3 % (ref 11.7–15.4)
WBC: 4.3 10*3/uL (ref 3.4–10.8)

## 2022-07-14 LAB — LIPID PANEL
Chol/HDL Ratio: 2.7 ratio (ref 0.0–4.4)
Cholesterol, Total: 170 mg/dL (ref 100–199)
HDL: 63 mg/dL (ref >39)
LDL Chol Calc (NIH): 92 mg/dL (ref 0–99)
Triglycerides: 80 mg/dL (ref 0–149)
VLDL Cholesterol Cal: 15 mg/dL (ref 5–40)

## 2022-07-14 LAB — CMP14+EGFR
ALT: 15 IU/L (ref 0–32)
AST: 21 IU/L (ref 0–40)
Albumin: 4.6 g/dL (ref 3.9–4.9)
Alkaline Phosphatase: 77 IU/L (ref 44–121)
BUN/Creatinine Ratio: 24 — ABNORMAL HIGH (ref 9–23)
BUN: 20 mg/dL (ref 6–24)
Bilirubin Total: 0.5 mg/dL (ref 0.0–1.2)
CO2: 22 mmol/L (ref 20–29)
Calcium: 9.6 mg/dL (ref 8.7–10.2)
Chloride: 104 mmol/L (ref 96–106)
Creatinine, Ser: 0.85 mg/dL (ref 0.57–1.00)
Globulin, Total: 2.1 g/dL (ref 1.5–4.5)
Glucose: 85 mg/dL (ref 70–99)
Potassium: 4.5 mmol/L (ref 3.5–5.2)
Sodium: 141 mmol/L (ref 134–144)
Total Protein: 6.7 g/dL (ref 6.0–8.5)
eGFR: 83 mL/min/{1.73_m2} (ref >59)

## 2022-07-14 LAB — HEMOGLOBIN A1C
Est. average glucose Bld gHb Est-mCnc: 103 mg/dL
Hgb A1c MFr Bld: 5.2 % (ref 4.8–5.6)

## 2022-07-14 LAB — TSH+FREE T4
Free T4: 1.3 ng/dL (ref 0.82–1.77)
TSH: 4.69 u[IU]/mL — ABNORMAL HIGH (ref 0.450–4.500)

## 2022-07-14 LAB — VITAMIN D 25 HYDROXY (VIT D DEFICIENCY, FRACTURES): Vit D, 25-Hydroxy: 40.4 ng/mL (ref 30.0–100.0)

## 2022-07-14 MED ORDER — TERBINAFINE HCL 250 MG PO TABS: 250.0000 mg | ORAL_TABLET | Freq: Every day | ORAL | 2 refills | Status: DC

## 2022-07-15 ENCOUNTER — Other Ambulatory Visit: Payer: Self-pay

## 2022-07-15 DIAGNOSIS — B351 Tinea unguium: Secondary | ICD-10-CM

## 2022-07-15 MED ORDER — TERBINAFINE HCL 250 MG PO TABS: 250.0000 mg | ORAL_TABLET | Freq: Every day | ORAL | 2 refills | Status: AC

## 2023-03-09 IMAGING — US US EXTREM LOW*R* LIMITED
1 series · 6 of 6 positions shown · non-contrast
Comparison: None.

CLINICAL DATA: Right knee pain.

EXAM:
ULTRASOUND RIGHT LOWER EXTREMITY LIMITED
TECHNIQUE: Ultrasound examination of the lower extremity soft tissues was
performed in the area of clinical concern.

[Series 1: us soft tissue lower extremity limited right (non- · 6 acquisitions, 6 frames shown]
[im 1/6]
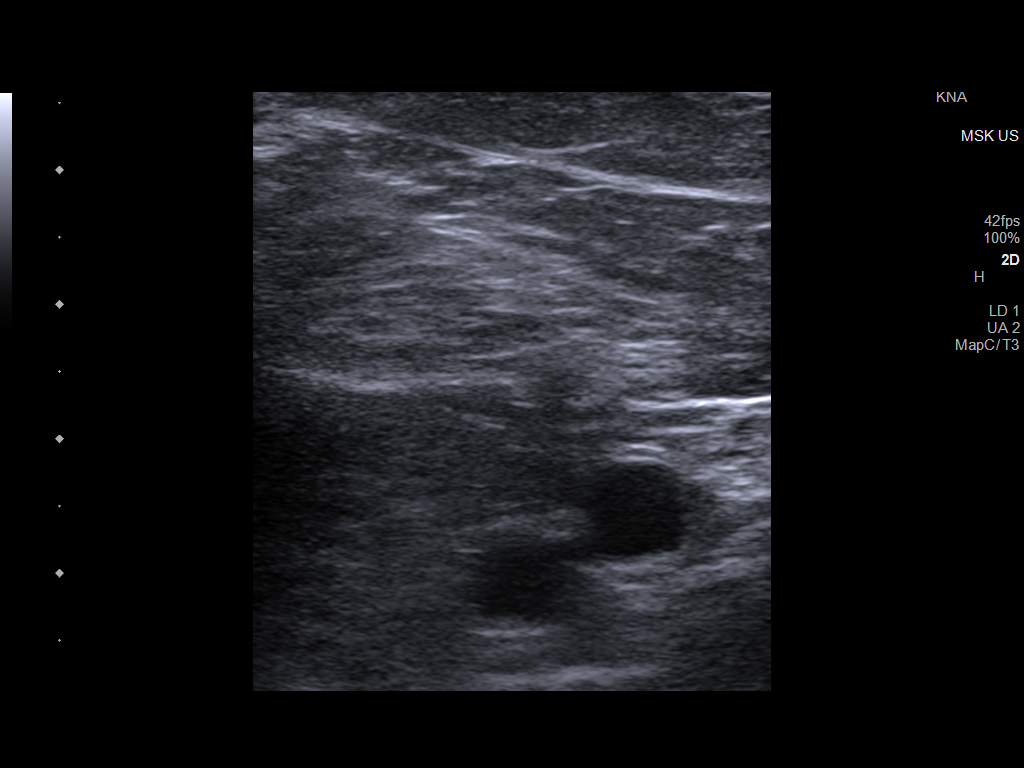
[im 2/6]
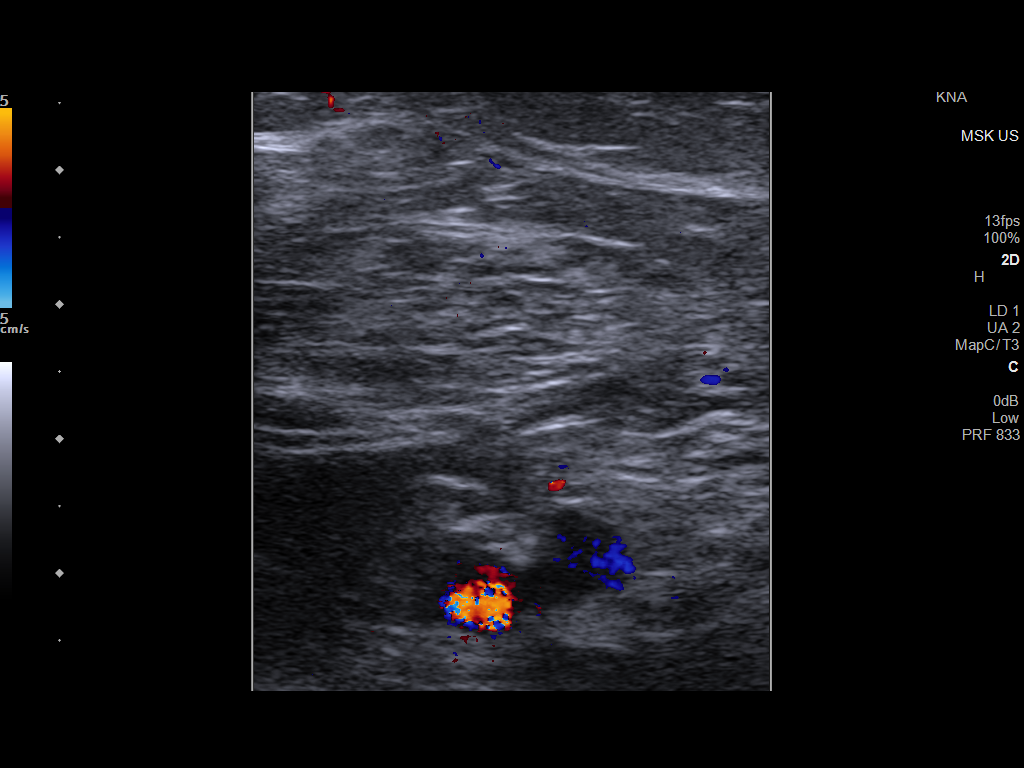
[im 3/6]
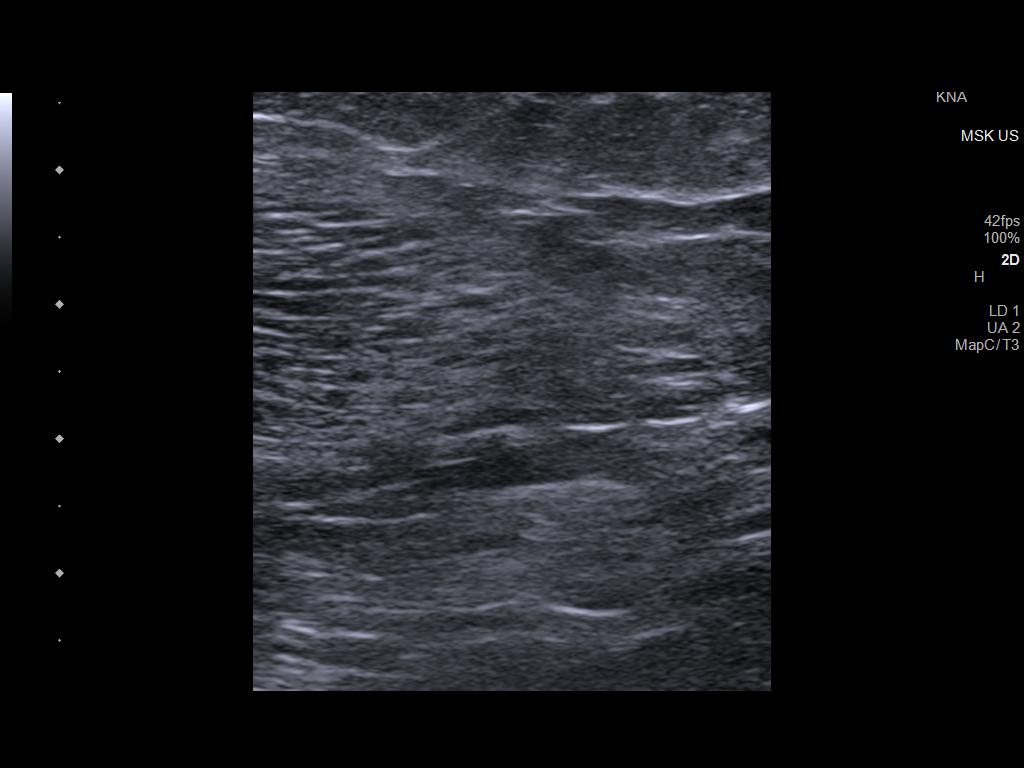
[im 4/6]
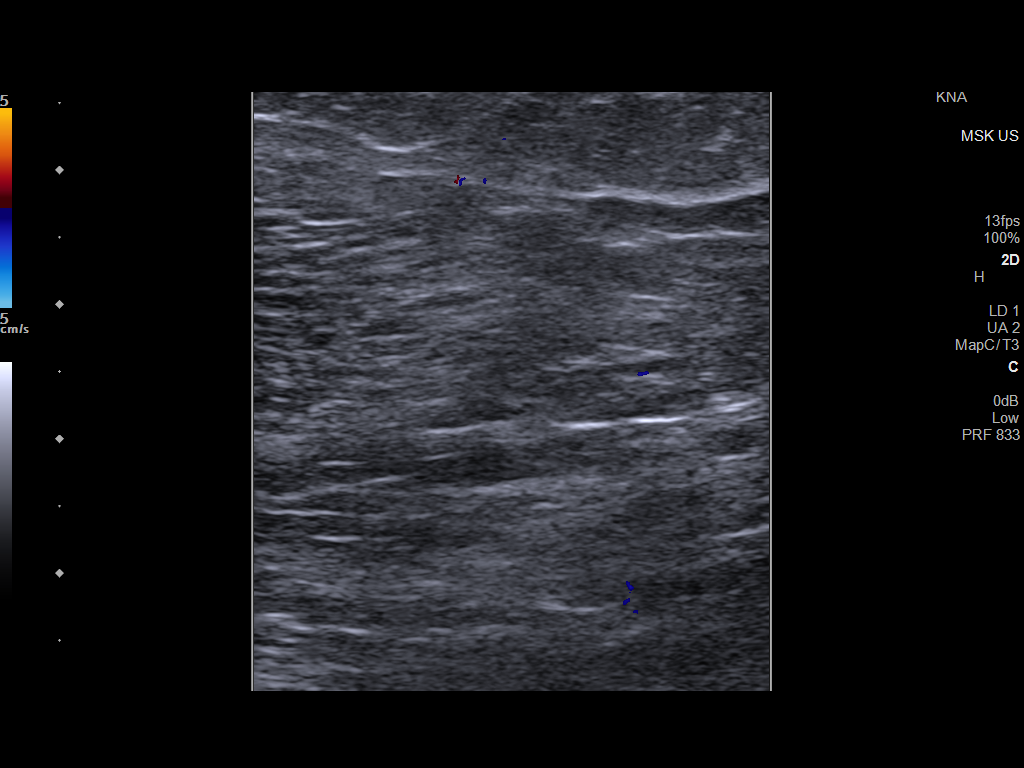
[im 5/6]
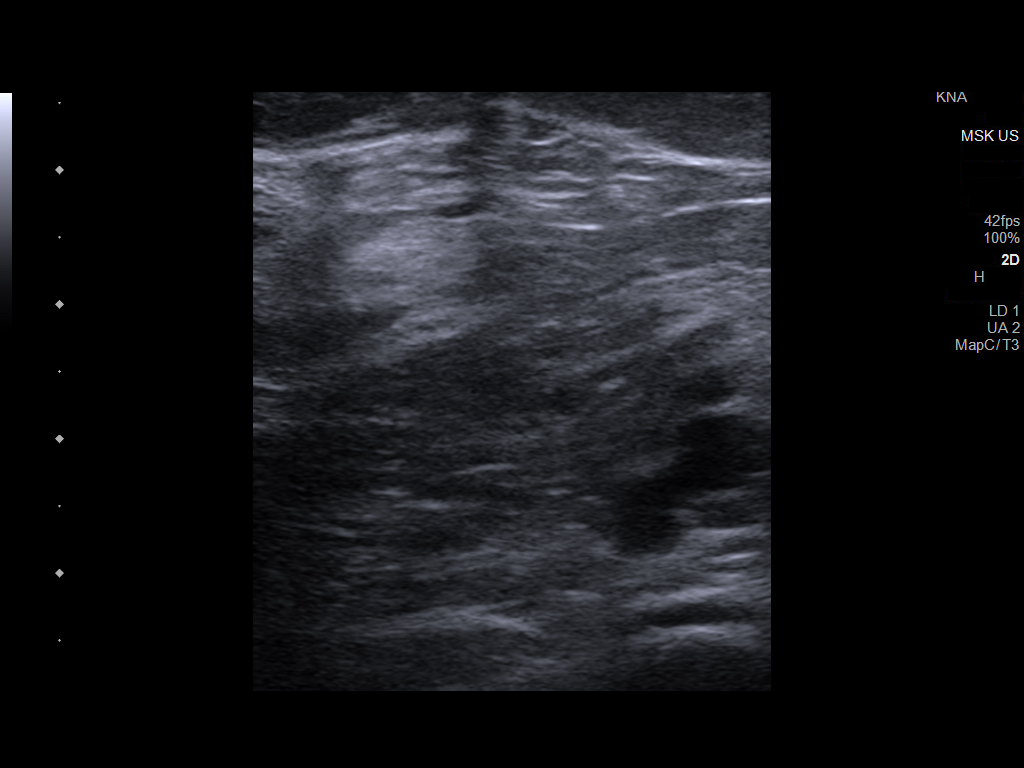
[im 6/6]
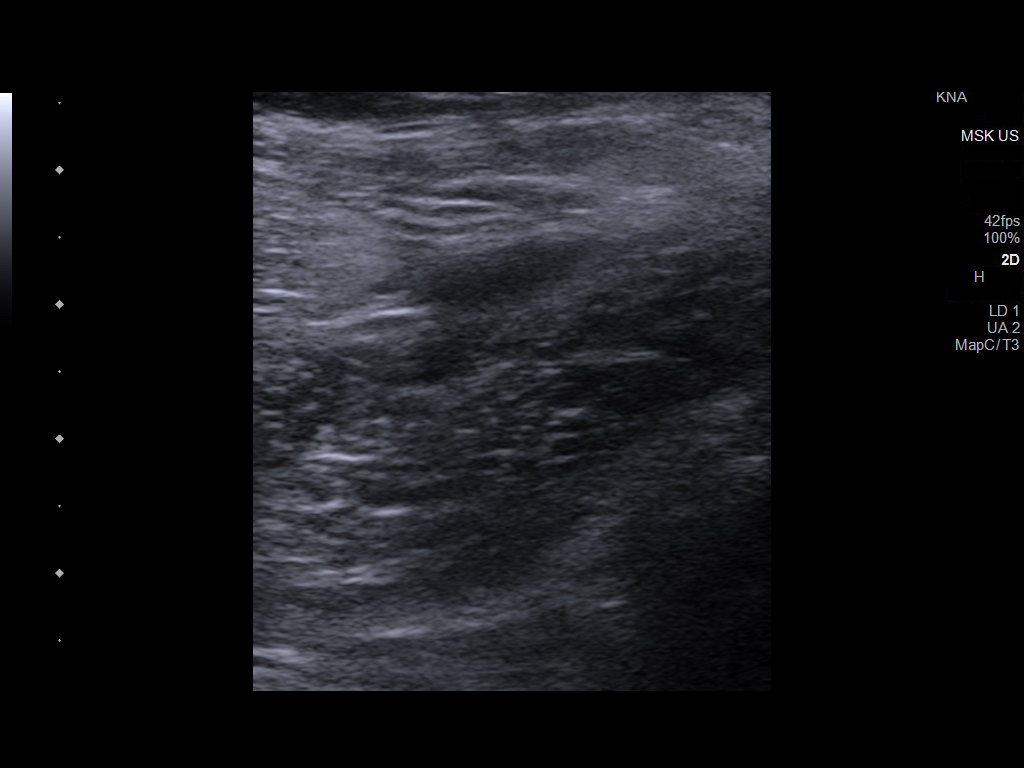

[6 of 6 positions shown; findings below may reference images not displayed]

FINDINGS: Sonographic imaging was performed targeted to the popliteal fossa.
No abnormality. No mass or cyst.
IMPRESSION: Negative sonographic assessment of the right popliteal fossa. No
popliteal cyst.

## 2023-05-04 DIAGNOSIS — L738 Other specified follicular disorders: Secondary | ICD-10-CM | POA: Diagnosis not present

## 2023-05-04 DIAGNOSIS — L570 Actinic keratosis: Secondary | ICD-10-CM | POA: Diagnosis not present

## 2023-07-10 DIAGNOSIS — L570 Actinic keratosis: Secondary | ICD-10-CM | POA: Diagnosis not present

## 2023-07-10 DIAGNOSIS — L309 Dermatitis, unspecified: Secondary | ICD-10-CM | POA: Diagnosis not present

## 2023-07-14 ENCOUNTER — Encounter: Payer: Self-pay | Admitting: Family Medicine

## 2023-07-14 ENCOUNTER — Ambulatory Visit (INDEPENDENT_AMBULATORY_CARE_PROVIDER_SITE_OTHER): Payer: 59 | Admitting: Family Medicine

## 2023-07-14 VITALS — BP 113/75 | HR 75 | Resp 16 | Ht 68.0 in | Wt 141.1 lb

## 2023-07-14 DIAGNOSIS — B351 Tinea unguium: Secondary | ICD-10-CM | POA: Diagnosis not present

## 2023-07-14 DIAGNOSIS — F4001 Agoraphobia with panic disorder: Secondary | ICD-10-CM | POA: Insufficient documentation

## 2023-07-14 DIAGNOSIS — R7301 Impaired fasting glucose: Secondary | ICD-10-CM

## 2023-07-14 DIAGNOSIS — E7849 Other hyperlipidemia: Secondary | ICD-10-CM

## 2023-07-14 DIAGNOSIS — Z0001 Encounter for general adult medical examination with abnormal findings: Secondary | ICD-10-CM

## 2023-07-14 DIAGNOSIS — E038 Other specified hypothyroidism: Secondary | ICD-10-CM

## 2023-07-14 DIAGNOSIS — E559 Vitamin D deficiency, unspecified: Secondary | ICD-10-CM

## 2023-07-14 DIAGNOSIS — Z1159 Encounter for screening for other viral diseases: Secondary | ICD-10-CM

## 2023-07-14 MED ORDER — ALPRAZOLAM 0.25 MG PO TABS: 0.2500 mg | ORAL_TABLET | ORAL | 0 refills | Status: AC | PRN

## 2023-07-14 MED ORDER — TERBINAFINE HCL 1 % EX CREA: 1.0000 | TOPICAL_CREAM | Freq: Two times a day (BID) | CUTANEOUS | 0 refills | Status: AC

## 2023-07-14 NOTE — Progress Notes (Deleted)
 Established Patient Office Visit  Subjective:  Patient ID: Marilyn Smith, female    DOB: 1970/04/07  Age: 53 y.o. MRN: [id number]  CC:  Chief Complaint  Patient presents with   Annual Exam    HPI Marilyn Smith is a 53 y.o. female with past medical history of *** presents for f/u of *** chronic medical conditions.  Past Medical History:  Diagnosis Date   Allergy    Anemia    Skin cancer     Past Surgical History:  Procedure Laterality Date   SKIN CANCER EXCISION  2023    Family History  Problem Relation Age of Onset   Hypertension Mother    Diabetes Mother    Hypertension Father    Skin cancer Father    Breast cancer Neg Hx    Colon cancer Neg Hx    Lung cancer Neg Hx     Social History   Socioeconomic History   Marital status: Married    Spouse name: Not on file   Number of children: 2   Years of education: Not on file   Highest education level: Not on file  Occupational History   Not on file  Tobacco Use   Smoking status: Never   Smokeless tobacco: Never  Vaping Use   Vaping status: Never Used  Substance and Sexual Activity   Alcohol use: Yes    Comment: occa   Drug use: Never   Sexual activity: Yes  Other Topics Concern   Not on file  Social History Narrative   Lives with her spouse.    Social Drivers of Corporate investment banker Strain: Not on file  Food Insecurity: Not on file  Transportation Needs: Not on file  Physical Activity: Not on file  Stress: Not on file  Social Connections: Not on file  Intimate Partner Violence: Not At Risk (08/18/2022)   Received from [facility]   Humiliation, Afraid, Rape, and Kick questionnaire    Within the last year, have you been afraid of your partner or ex-partner?: No    Within the last year, have you been humiliated or emotionally abused in other ways by your partner or ex-partner?: No    Within the last year, have you been kicked, hit, slapped, or otherwise physically hurt by your partner or  ex-partner?: No    Within the last year, have you been raped or forced to have any kind of sexual activity by your partner or ex-partner?: No    Outpatient Medications Prior to Visit  Medication Sig Dispense Refill   cetirizine  (ZYRTEC  ALLERGY) 10 MG tablet Take 1 tablet (10 mg total) by mouth daily. 30 tablet 0   estradiol (VIVELLE-DOT) 0.025 MG/24HR Place 1 patch onto the skin 2 (two) times a week.     Iron , Ferrous Sulfate , 325 (65 Fe) MG TABS Take 325 mg by mouth daily. 30 tablet 3   MULTIPLE VITAMIN PO Take 1 capsule by mouth daily.     progesterone (PROMETRIUM) 100 MG capsule Take 100 mg by mouth daily.     No facility-administered medications prior to visit.    Allergies  Allergen Reactions   Penicillins Rash    ROS Review of Systems    Objective:    Physical Exam  BP 113/75   Pulse 75   Resp 16   Ht 5' 8 (1.727 m)   Wt 141 lb 1.9 oz (64 kg)   SpO2 96%   BMI 21.46 kg/m  Wt Readings  from Last 3 Encounters:  07/14/23 141 lb 1.9 oz (64 kg)  07/12/22 146 lb 1.9 oz (66.3 kg)  07/27/21 142 lb (64.4 kg)    Lab Results  Component Value Date   TSH 4.690 (H) 07/13/2022   Lab Results  Component Value Date   WBC 4.3 07/13/2022   HGB 13.7 07/13/2022   HCT 42.2 07/13/2022   MCV 89 07/13/2022   PLT 246 07/13/2022   Lab Results  Component Value Date   NA 141 07/13/2022   K 4.5 07/13/2022   CO2 22 07/13/2022   GLUCOSE 85 07/13/2022   BUN 20 07/13/2022   CREATININE 0.85 07/13/2022   BILITOT 0.5 07/13/2022   ALKPHOS 77 07/13/2022   AST 21 07/13/2022   ALT 15 07/13/2022   PROT 6.7 07/13/2022   ALBUMIN 4.6 07/13/2022   CALCIUM 9.6 07/13/2022   EGFR 83 07/13/2022   Lab Results  Component Value Date   CHOL 170 07/13/2022   Lab Results  Component Value Date   HDL 63 07/13/2022   Lab Results  Component Value Date   LDLCALC 92 07/13/2022   Lab Results  Component Value Date   TRIG 80 07/13/2022   Lab Results  Component Value Date   CHOLHDL 2.7  07/13/2022   Lab Results  Component Value Date   HGBA1C 5.2 07/13/2022      Assessment & Plan:  There are no diagnoses linked to this encounter.  Follow-up: No follow-ups on file.   Eliasar Hlavaty, FNP

## 2023-07-14 NOTE — Assessment & Plan Note (Signed)

## 2023-07-14 NOTE — Assessment & Plan Note (Signed)
 Start applying Lamisil  0.1% cream twice daily to affected areas. Discussed Instructions for use: -Wash and dry the affected area thoroughly before application. -Apply a thin layer of the cream over the entire affected area and a small margin of surrounding skin. -Rub in gently and wash hands after application (unless treating hands)

## 2023-07-14 NOTE — Assessment & Plan Note (Signed)
 Start Xanax 0.25 mg as needed for acute anxiety symptoms.

## 2023-07-14 NOTE — Progress Notes (Signed)
 Complete physical exam  Patient: Marilyn Smith   DOB: [birth year]   53 y.o. Female  MRN: [id number]  Subjective:    Chief Complaint  Patient presents with   Annual Exam    Marilyn Smith is a 53 y.o. female who presents today for a complete physical exam. She reports consuming a general diet. The patient does not participate in regular exercise at present. She generally feels well. She reports sleeping well. She does not have additional problems to discuss today.    Most recent fall risk assessment:    07/14/2023    8:10 AM  Fall Risk   Falls in the past year? 0  Number falls in past yr: 0  Injury with Fall? 0  Follow up Falls evaluation completed     Most recent depression screenings:    07/14/2023    8:10 AM 07/12/2022   10:24 AM  PHQ 2/9 Scores  PHQ - 2 Score 0 0  PHQ- 9 Score  0    Dental: No current dental problems and No regular dental care   Patient Active Problem List   Diagnosis Date Noted   Agoraphobia with panic attacks 07/14/2023   Onychomycosis 07/12/2022   Encounter for general adult medical examination with abnormal findings 07/12/2022   Cystitis 07/12/2022   Annual physical exam 07/27/2021   Allergies 04/22/2021   Need for Tdap vaccination 04/22/2021   Iron  deficiency anemia 04/22/2021   Past Medical History:  Diagnosis Date   Allergy    Anemia    Skin cancer    Past Surgical History:  Procedure Laterality Date   SKIN CANCER EXCISION  2023   Social History   Tobacco Use   Smoking status: Never   Smokeless tobacco: Never  Vaping Use   Vaping status: Never Used  Substance Use Topics   Alcohol use: Yes    Comment: occa   Drug use: Never   Social History   Socioeconomic History   Marital status: Married    Spouse name: Not on file   Number of children: 2   Years of education: Not on file   Highest education level: Not on file  Occupational History   Not on file  Tobacco Use   Smoking status: Never   Smokeless tobacco: Never   Vaping Use   Vaping status: Never Used  Substance and Sexual Activity   Alcohol use: Yes    Comment: occa   Drug use: Never   Sexual activity: Yes  Other Topics Concern   Not on file  Social History Narrative   Lives with her spouse.    Social Drivers of Corporate investment banker Strain: Not on file  Food Insecurity: Not on file  Transportation Needs: Not on file  Physical Activity: Not on file  Stress: Not on file  Social Connections: Not on file  Intimate Partner Violence: Not At Risk (08/18/2022)   Received from [facility]   Humiliation, Afraid, Rape, and Kick questionnaire    Within the last year, have you been afraid of your partner or ex-partner?: No    Within the last year, have you been humiliated or emotionally abused in other ways by your partner or ex-partner?: No    Within the last year, have you been kicked, hit, slapped, or otherwise physically hurt by your partner or ex-partner?: No    Within the last year, have you been raped or forced to have any kind of sexual activity by your partner or  ex-partner?: No   Family Status  Relation Name Status   Mother  Alive   Father  Alive   Son  Alive       had congenital hypothyroidism   Neg Hx  (Not Specified)  No partnership data on file   Family History  Problem Relation Age of Onset   Hypertension Mother    Diabetes Mother    Hypertension Father    Skin cancer Father    Breast cancer Neg Hx    Colon cancer Neg Hx    Lung cancer Neg Hx    Allergies  Allergen Reactions   Penicillins Rash      Patient Care Team: Hannia Matchett, FNP as PCP - General (Family Medicine)   Outpatient Medications Prior to Visit  Medication Sig   cetirizine  (ZYRTEC  ALLERGY) 10 MG tablet Take 1 tablet (10 mg total) by mouth daily.   estradiol (VIVELLE-DOT) 0.025 MG/24HR Place 1 patch onto the skin 2 (two) times a week.   Iron , Ferrous Sulfate , 325 (65 Fe) MG TABS Take 325 mg by mouth daily.   MULTIPLE VITAMIN PO Take 1  capsule by mouth daily.   progesterone (PROMETRIUM) 100 MG capsule Take 100 mg by mouth daily.   No facility-administered medications prior to visit.    Review of Systems  Constitutional:  Negative for chills, fever and malaise/fatigue.  HENT:  Negative for congestion and sinus pain.   Eyes:  Negative for pain, discharge and redness.  Respiratory:  Negative for cough, sputum production and shortness of breath.   Cardiovascular:  Negative for chest pain, palpitations, claudication and leg swelling.  Gastrointestinal:  Negative for diarrhea, heartburn and nausea.  Genitourinary:  Negative for flank pain and frequency.  Musculoskeletal:  Negative for back pain and joint pain.  Skin:  Negative for itching.  Neurological:  Negative for dizziness, seizures and headaches.  Endo/Heme/Allergies:  Negative for environmental allergies.  Psychiatric/Behavioral:  Negative for memory loss. The patient does not have insomnia.        Objective:    BP 113/75   Pulse 75   Resp 16   Ht 5' 8 (1.727 m)   Wt 141 lb 1.9 oz (64 kg)   SpO2 96%   BMI 21.46 kg/m  BP Readings from Last 3 Encounters:  07/14/23 113/75  07/12/22 114/82  07/27/21 114/74   Wt Readings from Last 3 Encounters:  07/14/23 141 lb 1.9 oz (64 kg)  07/12/22 146 lb 1.9 oz (66.3 kg)  07/27/21 142 lb (64.4 kg)   SpO2 Readings from Last 3 Encounters:  07/14/23 96%  07/12/22 97%  07/27/21 98%      Physical Exam HENT:     Head: Normocephalic.     Left Ear: External ear normal.     Mouth/Throat:     Mouth: Mucous membranes are moist.   Eyes:     Extraocular Movements: Extraocular movements intact.     Pupils: Pupils are equal, round, and reactive to light.    Cardiovascular:     Rate and Rhythm: Normal rate and regular rhythm.     Heart sounds: No murmur heard. Pulmonary:     Effort: Pulmonary effort is normal.     Breath sounds: Normal breath sounds.  Abdominal:     Palpations: Abdomen is soft.     Tenderness:  There is no right CVA tenderness or left CVA tenderness.   Musculoskeletal:     Right lower leg: No edema.     Left lower  leg: No edema.   Skin:    Findings: No lesion or rash.   Neurological:     Mental Status: She is alert and oriented to person, place, and time.     GCS: GCS eye subscore is 4. GCS verbal subscore is 5. GCS motor subscore is 6.     Cranial Nerves: No facial asymmetry.     Sensory: No sensory deficit.     Motor: No weakness.     Coordination: Coordination normal. Finger-Nose-Finger Test normal.     Gait: Gait normal.   Psychiatric:        Judgment: Judgment normal.     No results found for any visits on 07/14/23. Last CBC Lab Results  Component Value Date   WBC 4.3 07/13/2022   HGB 13.7 07/13/2022   HCT 42.2 07/13/2022   MCV 89 07/13/2022   MCH 28.9 07/13/2022   RDW 12.3 07/13/2022   PLT 246 07/13/2022   Last metabolic panel Lab Results  Component Value Date   GLUCOSE 85 07/13/2022   NA 141 07/13/2022   K 4.5 07/13/2022   CL 104 07/13/2022   CO2 22 07/13/2022   BUN 20 07/13/2022   CREATININE 0.85 07/13/2022   EGFR 83 07/13/2022   CALCIUM 9.6 07/13/2022   PROT 6.7 07/13/2022   ALBUMIN 4.6 07/13/2022   LABGLOB 2.1 07/13/2022   AGRATIO 2.0 07/21/2021   BILITOT 0.5 07/13/2022   ALKPHOS 77 07/13/2022   AST 21 07/13/2022   ALT 15 07/13/2022   Last lipids Lab Results  Component Value Date   CHOL 170 07/13/2022   HDL 63 07/13/2022   LDLCALC 92 07/13/2022   TRIG 80 07/13/2022   CHOLHDL 2.7 07/13/2022   Last hemoglobin A1c Lab Results  Component Value Date   HGBA1C 5.2 07/13/2022   Last thyroid functions Lab Results  Component Value Date   TSH 4.690 (H) 07/13/2022   Last vitamin D  Lab Results  Component Value Date   VD25OH 40.4 07/13/2022   Last vitamin B12 and Folate No results found for: VITAMINB12, FOLATE      Assessment & Plan:    Routine Health Maintenance and Physical Exam  Immunization History  Administered  Date(s) Administered   Influenza,inj,Quad PF,6+ Mos 10/30/2018   Tdap 04/22/2021    Health Maintenance  Topic Date Due   Hepatitis B Vaccines (1 of 3 - 19+ 3-dose series) Never done   Zoster Vaccines- Shingrix (1 of 2) Never done   INFLUENZA VACCINE  08/18/2023   MAMMOGRAM  10/14/2023   Fecal DNA (Cologuard)  05/08/2024   Cervical Cancer Screening (HPV/Pap Cotest)  08/17/2025   DTaP/Tdap/Td (2 - Td or Tdap) 04/23/2031   Hepatitis C Screening  Completed   HIV Screening  Completed   HPV VACCINES  Aged Out   Meningococcal B Vaccine  Aged Out   COVID-19 Vaccine  Discontinued    Discussed health benefits of physical activity, and encouraged her to engage in regular exercise appropriate for her age and condition.  Encounter for general adult medical examination with abnormal findings Assessment & Plan: Physical exam as documented Discussed heart-healthy diet  Encouraged to Exercise: If you are able: 30 -60 minutes a day ,4 days a week, or 150 minutes a week. The longer the better. Combine stretch, strength, and aerobic activities Encourage to eat whole Food, Plant Predominant Nutrition is highly recommended: Eat Plenty of vegetables, Mushrooms, fruits, Legumes, Whole Grains, Nuts, seeds in lieu of processed meats, processed snacks/pastries red meat, poultry,  eggs.  Will f/u in 1 year for CPE      Agoraphobia with panic attacks Assessment & Plan: Start Xanax 0.25 mg as needed for acute anxiety symptoms.   Orders: -     ALPRAZolam; Take 1 tablet (0.25 mg total) by mouth as needed for anxiety.  Dispense: 15 tablet; Refill: 0  Onychomycosis Assessment & Plan: Start applying Lamisil  0.1% cream twice daily to affected areas. Discussed Instructions for use: -Wash and dry the affected area thoroughly before application. -Apply a thin layer of the cream over the entire affected area and a small margin of surrounding skin. -Rub in gently and wash hands after application (unless  treating hands)  Orders: -     Terbinafine  HCl; Apply 1 Application topically 2 (two) times daily.  Dispense: 30 g; Refill: 0  IFG (impaired fasting glucose) -     Hemoglobin A1c  Vitamin D  deficiency -     VITAMIN D  25 Hydroxy (Vit-D Deficiency, Fractures)  Need for hepatitis C screening test -     Hepatitis C antibody  TSH (thyroid-stimulating hormone deficiency) -     TSH + free T4  Other hyperlipidemia -     Lipid panel -     CMP14+EGFR -     CBC with Differential/Platelet    No follow-ups on file.   Note: This chart has been completed using Engineer, civil (consulting) software, and while attempts have been made to ensure accuracy, certain words and phrases may not be transcribed as intended.    Dannae Kato, FNP

## 2023-07-14 NOTE — Patient Instructions (Addendum)
 I appreciate the opportunity to provide care to you today!    Follow up:  1 year CPE  Labs: please stop by the lab during the week to get your blood drawn (CBC, CMP, TSH, Lipid profile, HgA1c, Vit D)  Screening:  Hep C  Agoraphobia with Panic Attacks Start Xanax 0.25 mg as needed for acute anxiety symptoms.  Onychomycosis Start applying Lamisil  0.1% cream twice daily to affected areas. Instructions for use: -Wash and dry the affected area thoroughly before application. -Apply a thin layer of the cream over the entire affected area and a small margin of surrounding skin. -Rub in gently and wash hands after application (unless treating hands).  Continue use as directed, even if symptoms improve, to prevent recurrence.    Please follow up if your symptoms worsen or fail to improve.   Please stop by your local pharmacy and get your Shingles vaccine     Please continue to a heart-healthy diet and increase your physical activities. Try to exercise for at least five days a week.    It was a pleasure to see you and I look forward to continuing to work together on your health and well-being. Please do not hesitate to call the office if you need care or have questions about your care.  In case of emergency, please visit the Emergency Department for urgent care, or contact our clinic at [phone] to schedule an appointment. We're here to help you!   Have a wonderful day and week. With Gratitude, Sanaya Gwilliam MSN, FNP-BC

## 2023-07-17 DIAGNOSIS — Z1159 Encounter for screening for other viral diseases: Secondary | ICD-10-CM | POA: Diagnosis not present

## 2023-07-17 DIAGNOSIS — E038 Other specified hypothyroidism: Secondary | ICD-10-CM | POA: Diagnosis not present

## 2023-07-17 DIAGNOSIS — E559 Vitamin D deficiency, unspecified: Secondary | ICD-10-CM | POA: Diagnosis not present

## 2023-07-17 DIAGNOSIS — R7301 Impaired fasting glucose: Secondary | ICD-10-CM | POA: Diagnosis not present

## 2023-07-17 DIAGNOSIS — E7849 Other hyperlipidemia: Secondary | ICD-10-CM | POA: Diagnosis not present

## 2023-07-18 LAB — CMP14+EGFR
ALT: 14 IU/L (ref 0–32)
AST: 16 IU/L (ref 0–40)
Albumin: 4.5 g/dL (ref 3.8–4.9)
Alkaline Phosphatase: 81 IU/L (ref 44–121)
BUN/Creatinine Ratio: 25 — ABNORMAL HIGH (ref 9–23)
BUN: 20 mg/dL (ref 6–24)
Bilirubin Total: 0.5 mg/dL (ref 0.0–1.2)
CO2: 22 mmol/L (ref 20–29)
Calcium: 9.7 mg/dL (ref 8.7–10.2)
Chloride: 102 mmol/L (ref 96–106)
Creatinine, Ser: 0.79 mg/dL (ref 0.57–1.00)
Globulin, Total: 2.2 g/dL (ref 1.5–4.5)
Glucose: 86 mg/dL (ref 70–99)
Potassium: 4.4 mmol/L (ref 3.5–5.2)
Sodium: 143 mmol/L (ref 134–144)
Total Protein: 6.7 g/dL (ref 6.0–8.5)
eGFR: 91 mL/min/{1.73_m2} (ref >59)

## 2023-07-18 LAB — VITAMIN D 25 HYDROXY (VIT D DEFICIENCY, FRACTURES): Vit D, 25-Hydroxy: 31.5 ng/mL (ref 30.0–100.0)

## 2023-07-18 LAB — CBC WITH DIFFERENTIAL/PLATELET
Basophils Absolute: 0 10*3/uL (ref 0.0–0.2)
Basos: 1 %
EOS (ABSOLUTE): 0.1 10*3/uL (ref 0.0–0.4)
Eos: 2 %
Hematocrit: 43.5 % (ref 34.0–46.6)
Hemoglobin: 14 g/dL (ref 11.1–15.9)
Immature Grans (Abs): 0 10*3/uL (ref 0.0–0.1)
Immature Granulocytes: 0 %
Lymphocytes Absolute: 1.2 10*3/uL (ref 0.7–3.1)
Lymphs: 32 %
MCH: 29.3 pg (ref 26.6–33.0)
MCHC: 32.2 g/dL (ref 31.5–35.7)
MCV: 91 fL (ref 79–97)
Monocytes Absolute: 0.2 10*3/uL (ref 0.1–0.9)
Monocytes: 6 %
Neutrophils Absolute: 2.3 10*3/uL (ref 1.4–7.0)
Neutrophils: 59 %
Platelets: 196 10*3/uL (ref 150–450)
RBC: 4.78 x10E6/uL (ref 3.77–5.28)
RDW: 12.3 % (ref 11.7–15.4)
WBC: 3.9 10*3/uL (ref 3.4–10.8)

## 2023-07-18 LAB — HEPATITIS C ANTIBODY: Hep C Virus Ab: NONREACTIVE

## 2023-07-18 LAB — TSH+FREE T4
Free T4: 1.32 ng/dL (ref 0.82–1.77)
TSH: 3.59 u[IU]/mL (ref 0.450–4.500)

## 2023-07-18 LAB — LIPID PANEL
Chol/HDL Ratio: 3.1 ratio (ref 0.0–4.4)
Cholesterol, Total: 194 mg/dL (ref 100–199)
HDL: 63 mg/dL (ref >39)
LDL Chol Calc (NIH): 111 mg/dL — ABNORMAL HIGH (ref 0–99)
Triglycerides: 110 mg/dL (ref 0–149)
VLDL Cholesterol Cal: 20 mg/dL (ref 5–40)

## 2023-07-18 LAB — HEMOGLOBIN A1C
Est. average glucose Bld gHb Est-mCnc: 97 mg/dL
Hgb A1c MFr Bld: 5 % (ref 4.8–5.6)

## 2023-07-21 ENCOUNTER — Ambulatory Visit: Payer: Self-pay | Admitting: Family Medicine

## 2023-07-21 NOTE — Progress Notes (Signed)
 Your LDL cholesterol is slightly elevated. I recommend that you continue to implement lifestyle changes by reducing your intake of greasy, fatty, and starchy foods and increasing your physical activity.  All other lab results are stable.

## 2023-07-24 NOTE — Progress Notes (Signed)
 Patient read my chart message

## 2023-08-29 ENCOUNTER — Encounter: Payer: Self-pay | Admitting: Family Medicine

## 2023-09-05 ENCOUNTER — Other Ambulatory Visit: Payer: Self-pay | Admitting: Family Medicine

## 2023-09-05 DIAGNOSIS — B351 Tinea unguium: Secondary | ICD-10-CM

## 2023-09-05 MED ORDER — TERBINAFINE HCL 250 MG PO TABS: 250.0000 mg | ORAL_TABLET | Freq: Every day | ORAL | 0 refills | Status: DC

## 2023-09-07 ENCOUNTER — Other Ambulatory Visit: Payer: Self-pay

## 2023-09-07 DIAGNOSIS — B351 Tinea unguium: Secondary | ICD-10-CM

## 2023-09-07 MED ORDER — TERBINAFINE HCL 250 MG PO TABS: 250.0000 mg | ORAL_TABLET | Freq: Every day | ORAL | 0 refills | Status: AC

## 2024-07-15 ENCOUNTER — Encounter: Admitting: Family Medicine
